# Patient Record
Sex: Male | Born: 1935 | Race: White | Hispanic: No | Marital: Married | Smoking: Former smoker
Health system: Southern US, Community
[De-identification: ages and names within clinical notes are randomized; demographics above are authoritative.]

## PROBLEM LIST (undated history)

## (undated) DIAGNOSIS — I10 Essential (primary) hypertension: Secondary | ICD-10-CM

## (undated) DIAGNOSIS — E119 Type 2 diabetes mellitus without complications: Secondary | ICD-10-CM

## (undated) DIAGNOSIS — J8410 Pulmonary fibrosis, unspecified: Secondary | ICD-10-CM

## (undated) DIAGNOSIS — G459 Transient cerebral ischemic attack, unspecified: Secondary | ICD-10-CM

## (undated) DIAGNOSIS — F0390 Unspecified dementia without behavioral disturbance: Secondary | ICD-10-CM

## (undated) HISTORY — PX: CORONARY ANGIOPLASTY WITH STENT PLACEMENT: SHX49

---

## 2013-07-19 ENCOUNTER — Emergency Department: Payer: Self-pay | Admitting: Internal Medicine

## 2016-04-06 ENCOUNTER — Emergency Department: Payer: No Typology Code available for payment source

## 2016-04-06 ENCOUNTER — Encounter: Payer: Self-pay | Admitting: Emergency Medicine

## 2016-04-06 ENCOUNTER — Emergency Department
Admission: EM | Admit: 2016-04-06 | Discharge: 2016-04-06 | Disposition: A | Discharge status: Home / Self Care | Payer: No Typology Code available for payment source | Attending: Emergency Medicine | Admitting: Emergency Medicine

## 2016-04-06 DIAGNOSIS — S0091XA Abrasion of unspecified part of head, initial encounter: Secondary | ICD-10-CM | POA: Insufficient documentation

## 2016-04-06 DIAGNOSIS — Y92410 Unspecified street and highway as the place of occurrence of the external cause: Secondary | ICD-10-CM | POA: Diagnosis not present

## 2016-04-06 DIAGNOSIS — Z955 Presence of coronary angioplasty implant and graft: Secondary | ICD-10-CM | POA: Diagnosis not present

## 2016-04-06 DIAGNOSIS — Y999 Unspecified external cause status: Secondary | ICD-10-CM | POA: Insufficient documentation

## 2016-04-06 DIAGNOSIS — Y9389 Activity, other specified: Secondary | ICD-10-CM | POA: Insufficient documentation

## 2016-04-06 DIAGNOSIS — Z87891 Personal history of nicotine dependence: Secondary | ICD-10-CM | POA: Insufficient documentation

## 2016-04-06 DIAGNOSIS — I10 Essential (primary) hypertension: Secondary | ICD-10-CM | POA: Diagnosis not present

## 2016-04-06 DIAGNOSIS — S29011A Strain of muscle and tendon of front wall of thorax, initial encounter: Secondary | ICD-10-CM | POA: Insufficient documentation

## 2016-04-06 DIAGNOSIS — E119 Type 2 diabetes mellitus without complications: Secondary | ICD-10-CM | POA: Insufficient documentation

## 2016-04-06 DIAGNOSIS — T148XXA Other injury of unspecified body region, initial encounter: Secondary | ICD-10-CM

## 2016-04-06 DIAGNOSIS — S299XXA Unspecified injury of thorax, initial encounter: Secondary | ICD-10-CM | POA: Diagnosis present

## 2016-04-06 DIAGNOSIS — R0789 Other chest pain: Secondary | ICD-10-CM

## 2016-04-06 DIAGNOSIS — G459 Transient cerebral ischemic attack, unspecified: Secondary | ICD-10-CM | POA: Insufficient documentation

## 2016-04-06 HISTORY — DX: Type 2 diabetes mellitus without complications: E11.9

## 2016-04-06 HISTORY — DX: Transient cerebral ischemic attack, unspecified: G45.9

## 2016-04-06 HISTORY — DX: Pulmonary fibrosis, unspecified: J84.10

## 2016-04-06 HISTORY — DX: Essential (primary) hypertension: I10

## 2016-04-06 LAB — BASIC METABOLIC PANEL
ANION GAP: 7 (ref 5–15)
BUN: 13 mg/dL (ref 6–20)
CALCIUM: 9 mg/dL (ref 8.9–10.3)
CO2: 24 mmol/L (ref 22–32)
Chloride: 103 mmol/L (ref 101–111)
Creatinine, Ser: 1.31 mg/dL — ABNORMAL HIGH (ref 0.61–1.24)
GFR calc Af Amer: 58 mL/min — ABNORMAL LOW (ref >60)
GFR calc non Af Amer: 50 mL/min — ABNORMAL LOW (ref >60)
GLUCOSE: 164 mg/dL — AB (ref 65–99)
Potassium: 4.4 mmol/L (ref 3.5–5.1)
Sodium: 134 mmol/L — ABNORMAL LOW (ref 135–145)

## 2016-04-06 LAB — CBC
HEMATOCRIT: 44.6 % (ref 40.0–52.0)
HEMOGLOBIN: 15.4 g/dL (ref 13.0–18.0)
MCH: 30.8 pg (ref 26.0–34.0)
MCHC: 34.5 g/dL (ref 32.0–36.0)
MCV: 89.3 fL (ref 80.0–100.0)
Platelets: 121 10*3/uL — ABNORMAL LOW (ref 150–440)
RBC: 4.99 MIL/uL (ref 4.40–5.90)
RDW: 13.7 % (ref 11.5–14.5)
WBC: 12.1 10*3/uL — ABNORMAL HIGH (ref 3.8–10.6)

## 2016-04-06 LAB — TROPONIN I
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
## 2019-08-01 ENCOUNTER — Emergency Department: Payer: No Typology Code available for payment source

## 2019-08-01 ENCOUNTER — Observation Stay
Admission: EM | Admit: 2019-08-01 | Discharge: 2019-08-02 | Disposition: A | Discharge status: Home / Self Care | Payer: No Typology Code available for payment source | Attending: Internal Medicine | Admitting: Internal Medicine

## 2019-08-01 ENCOUNTER — Other Ambulatory Visit: Payer: Self-pay

## 2019-08-01 DIAGNOSIS — Z7982 Long term (current) use of aspirin: Secondary | ICD-10-CM | POA: Insufficient documentation

## 2019-08-01 DIAGNOSIS — I10 Essential (primary) hypertension: Secondary | ICD-10-CM | POA: Diagnosis present

## 2019-08-01 DIAGNOSIS — S01111A Laceration without foreign body of right eyelid and periocular area, initial encounter: Secondary | ICD-10-CM | POA: Diagnosis not present

## 2019-08-01 DIAGNOSIS — Z7902 Long term (current) use of antithrombotics/antiplatelets: Secondary | ICD-10-CM | POA: Diagnosis not present

## 2019-08-01 DIAGNOSIS — Z8673 Personal history of transient ischemic attack (TIA), and cerebral infarction without residual deficits: Secondary | ICD-10-CM | POA: Diagnosis not present

## 2019-08-01 DIAGNOSIS — Z880 Allergy status to penicillin: Secondary | ICD-10-CM | POA: Diagnosis not present

## 2019-08-01 DIAGNOSIS — J8410 Pulmonary fibrosis, unspecified: Secondary | ICD-10-CM | POA: Diagnosis present

## 2019-08-01 DIAGNOSIS — I129 Hypertensive chronic kidney disease with stage 1 through stage 4 chronic kidney disease, or unspecified chronic kidney disease: Secondary | ICD-10-CM | POA: Diagnosis not present

## 2019-08-01 DIAGNOSIS — R4701 Aphasia: Secondary | ICD-10-CM | POA: Insufficient documentation

## 2019-08-01 DIAGNOSIS — K219 Gastro-esophageal reflux disease without esophagitis: Secondary | ICD-10-CM | POA: Diagnosis not present

## 2019-08-01 DIAGNOSIS — U071 COVID-19: Secondary | ICD-10-CM

## 2019-08-01 DIAGNOSIS — N1831 Chronic kidney disease, stage 3a: Secondary | ICD-10-CM

## 2019-08-01 DIAGNOSIS — Z87891 Personal history of nicotine dependence: Secondary | ICD-10-CM | POA: Insufficient documentation

## 2019-08-01 DIAGNOSIS — Z79899 Other long term (current) drug therapy: Secondary | ICD-10-CM | POA: Insufficient documentation

## 2019-08-01 DIAGNOSIS — E162 Hypoglycemia, unspecified: Secondary | ICD-10-CM | POA: Diagnosis present

## 2019-08-01 DIAGNOSIS — Z955 Presence of coronary angioplasty implant and graft: Secondary | ICD-10-CM | POA: Insufficient documentation

## 2019-08-01 DIAGNOSIS — G9389 Other specified disorders of brain: Secondary | ICD-10-CM | POA: Insufficient documentation

## 2019-08-01 DIAGNOSIS — F329 Major depressive disorder, single episode, unspecified: Secondary | ICD-10-CM | POA: Diagnosis not present

## 2019-08-01 DIAGNOSIS — E785 Hyperlipidemia, unspecified: Secondary | ICD-10-CM | POA: Insufficient documentation

## 2019-08-01 DIAGNOSIS — I2510 Atherosclerotic heart disease of native coronary artery without angina pectoris: Secondary | ICD-10-CM | POA: Diagnosis present

## 2019-08-01 DIAGNOSIS — N183 Chronic kidney disease, stage 3 unspecified: Secondary | ICD-10-CM | POA: Diagnosis present

## 2019-08-01 DIAGNOSIS — E1122 Type 2 diabetes mellitus with diabetic chronic kidney disease: Secondary | ICD-10-CM | POA: Diagnosis not present

## 2019-08-01 DIAGNOSIS — Z881 Allergy status to other antibiotic agents status: Secondary | ICD-10-CM | POA: Insufficient documentation

## 2019-08-01 DIAGNOSIS — Z888 Allergy status to other drugs, medicaments and biological substances status: Secondary | ICD-10-CM | POA: Insufficient documentation

## 2019-08-01 DIAGNOSIS — R9082 White matter disease, unspecified: Secondary | ICD-10-CM | POA: Insufficient documentation

## 2019-08-01 DIAGNOSIS — G459 Transient cerebral ischemic attack, unspecified: Secondary | ICD-10-CM | POA: Diagnosis present

## 2019-08-01 DIAGNOSIS — S0181XA Laceration without foreign body of other part of head, initial encounter: Secondary | ICD-10-CM | POA: Diagnosis not present

## 2019-08-01 DIAGNOSIS — E1129 Type 2 diabetes mellitus with other diabetic kidney complication: Secondary | ICD-10-CM

## 2019-08-01 DIAGNOSIS — E119 Type 2 diabetes mellitus without complications: Secondary | ICD-10-CM | POA: Diagnosis not present

## 2019-08-01 DIAGNOSIS — E11649 Type 2 diabetes mellitus with hypoglycemia without coma: Secondary | ICD-10-CM | POA: Diagnosis not present

## 2019-08-01 DIAGNOSIS — W19XXXA Unspecified fall, initial encounter: Secondary | ICD-10-CM | POA: Insufficient documentation

## 2019-08-01 DIAGNOSIS — R479 Unspecified speech disturbances: Secondary | ICD-10-CM | POA: Diagnosis present

## 2019-08-01 DIAGNOSIS — F418 Other specified anxiety disorders: Secondary | ICD-10-CM | POA: Diagnosis present

## 2019-08-01 HISTORY — DX: COVID-19: U07.1

## 2019-08-01 LAB — GLUCOSE, CAPILLARY
Glucose-Capillary: 105 mg/dL — ABNORMAL HIGH (ref 70–99)
Glucose-Capillary: 110 mg/dL — ABNORMAL HIGH (ref 70–99)
Glucose-Capillary: 188 mg/dL — ABNORMAL HIGH (ref 70–99)
Glucose-Capillary: 228 mg/dL — ABNORMAL HIGH (ref 70–99)
Glucose-Capillary: 27 mg/dL — CL (ref 70–99)
Glucose-Capillary: 58 mg/dL — ABNORMAL LOW (ref 70–99)
Glucose-Capillary: 65 mg/dL — ABNORMAL LOW (ref 70–99)
Glucose-Capillary: 86 mg/dL (ref 70–99)

## 2019-08-01 LAB — COMPREHENSIVE METABOLIC PANEL
ALT: 14 U/L (ref 0–44)
AST: 32 U/L (ref 15–41)
Albumin: 3.2 g/dL — ABNORMAL LOW (ref 3.5–5.0)
Alkaline Phosphatase: 76 U/L (ref 38–126)
Anion gap: 7 (ref 5–15)
BUN: 14 mg/dL (ref 8–23)
CO2: 27 mmol/L (ref 22–32)
Calcium: 8.2 mg/dL — ABNORMAL LOW (ref 8.9–10.3)
Chloride: 98 mmol/L (ref 98–111)
Creatinine, Ser: 1.29 mg/dL — ABNORMAL HIGH (ref 0.61–1.24)
GFR calc Af Amer: 59 mL/min — ABNORMAL LOW (ref >60)
GFR calc non Af Amer: 51 mL/min — ABNORMAL LOW (ref >60)
Glucose, Bld: 267 mg/dL — ABNORMAL HIGH (ref 70–99)
Potassium: 3.5 mmol/L (ref 3.5–5.1)
Sodium: 132 mmol/L — ABNORMAL LOW (ref 135–145)
Total Bilirubin: 0.5 mg/dL (ref 0.3–1.2)
Total Protein: 6.4 g/dL — ABNORMAL LOW (ref 6.5–8.1)

## 2019-08-01 LAB — CBC WITH DIFFERENTIAL/PLATELET
Abs Immature Granulocytes: 0.03 10*3/uL (ref 0.00–0.07)
Basophils Absolute: 0 10*3/uL (ref 0.0–0.1)
Basophils Relative: 1 %
Eosinophils Absolute: 0.1 10*3/uL (ref 0.0–0.5)
Eosinophils Relative: 1 %
HCT: 38.1 % — ABNORMAL LOW (ref 39.0–52.0)
Hemoglobin: 12.7 g/dL — ABNORMAL LOW (ref 13.0–17.0)
Immature Granulocytes: 1 %
Lymphocytes Relative: 15 %
Lymphs Abs: 0.8 10*3/uL (ref 0.7–4.0)
MCH: 30.4 pg (ref 26.0–34.0)
MCHC: 33.3 g/dL (ref 30.0–36.0)
MCV: 91.1 fL (ref 80.0–100.0)
Monocytes Absolute: 0.6 10*3/uL (ref 0.1–1.0)
Monocytes Relative: 12 %
Neutro Abs: 3.6 10*3/uL (ref 1.7–7.7)
Neutrophils Relative %: 70 %
Platelets: 151 10*3/uL (ref 150–400)
RBC: 4.18 MIL/uL — ABNORMAL LOW (ref 4.22–5.81)
RDW: 13.1 % (ref 11.5–15.5)
WBC: 5.1 10*3/uL (ref 4.0–10.5)
nRBC: 0 % (ref 0.0–0.2)

## 2019-08-01 LAB — URINALYSIS, ROUTINE W REFLEX MICROSCOPIC
Bacteria, UA: NONE SEEN
Bilirubin Urine: NEGATIVE
Glucose, UA: 150 mg/dL — AB
Ketones, ur: NEGATIVE mg/dL
Leukocytes,Ua: NEGATIVE
Nitrite: NEGATIVE
Protein, ur: 30 mg/dL — AB
Specific Gravity, Urine: 1.01 (ref 1.005–1.030)
pH: 5 (ref 5.0–8.0)

## 2019-08-01 LAB — RESPIRATORY PANEL BY RT PCR (FLU A&B, COVID)
Influenza A by PCR: NEGATIVE
Influenza B by PCR: NEGATIVE
SARS Coronavirus 2 by RT PCR: POSITIVE — AB

## 2019-08-01 LAB — PROCALCITONIN: Procalcitonin: <0.1 ng/mL

## 2019-08-01 LAB — TROPONIN I (HIGH SENSITIVITY)
Troponin I (High Sensitivity): 7 ng/L (ref <18)
Troponin I (High Sensitivity): 9 ng/L (ref <18)

## 2019-08-01 MED ORDER — SODIUM CHLORIDE 4 MEQ/ML IV SOLN: INTRAVENOUS | Status: DC

## 2019-08-01 MED ORDER — METOPROLOL TARTRATE 25 MG PO TABS
25.0000 mg | ORAL_TABLET | Freq: Two times a day (BID) | ORAL | Status: DC
  Administered 2019-08-01: 25 mg via ORAL
  Filled 2019-08-01 (×2): qty 1

## 2019-08-01 MED ORDER — OCTREOTIDE ACETATE 100 MCG/ML IJ SOLN
50.0000 ug | Freq: Once | INTRAMUSCULAR | Status: AC
  Administered 2019-08-01: 50 ug via SUBCUTANEOUS
  Filled 2019-08-01: qty 0.5

## 2019-08-01 MED ORDER — ACETAMINOPHEN 500 MG PO TABS: 1000.0000 mg | ORAL_TABLET | Freq: Four times a day (QID) | ORAL | Status: DC | PRN

## 2019-08-01 MED ORDER — DEXTROSE 250 MG/ML IV SOLN: 25.0000 g | Freq: Once | INTRAVENOUS | Status: DC

## 2019-08-01 MED ORDER — HYDRALAZINE HCL 50 MG PO TABS: 25.0000 mg | ORAL_TABLET | Freq: Three times a day (TID) | ORAL | Status: DC | PRN

## 2019-08-01 MED ORDER — DEXTROSE 50 % IV SOLN
25.0000 mL | Freq: Once | INTRAVENOUS | Status: AC
  Administered 2019-08-01: 13:00:00 25 mL via INTRAVENOUS

## 2019-08-01 MED ORDER — DM-GUAIFENESIN ER 30-600 MG PO TB12
1.0000 | ORAL_TABLET | Freq: Two times a day (BID) | ORAL | Status: DC
  Administered 2019-08-01 – 2019-08-02 (×2): 1 via ORAL
  Filled 2019-08-01 (×2): qty 1

## 2019-08-01 MED ORDER — SODIUM CHLORIDE 0.9 % IV BOLUS
500.0000 mL | Freq: Once | INTRAVENOUS | Status: AC
Start: 2019-08-01 — End: 2019-08-01
  Administered 2019-08-01: 500 mL via INTRAVENOUS

## 2019-08-01 MED ORDER — KETOROLAC TROMETHAMINE 15 MG/ML IJ SOLN
7.5000 mg | Freq: Once | INTRAMUSCULAR | Status: AC
  Administered 2019-08-01: 7.5 mg via INTRAVENOUS
  Filled 2019-08-01 (×2): qty 1

## 2019-08-01 MED ORDER — DEXTROSE 10 % IV SOLN: INTRAVENOUS | Status: DC

## 2019-08-01 MED ORDER — DEXTROSE 50 % IV SOLN
25.0000 mL | Freq: Once | INTRAVENOUS | Status: AC
  Administered 2019-08-01: 25 mL via INTRAVENOUS

## 2019-08-01 MED ORDER — DEXTROSE 50 % IV SOLN
25.0000 mL | INTRAVENOUS | Status: DC | PRN
  Administered 2019-08-01: 25 mL via INTRAVENOUS
  Filled 2019-08-01: qty 50

## 2019-08-01 MED ORDER — METOPROLOL TARTRATE 25 MG PO TABS: 25.0000 mg | ORAL_TABLET | Freq: Two times a day (BID) | ORAL | Status: DC

## 2019-08-01 MED ORDER — IPRATROPIUM BROMIDE HFA 17 MCG/ACT IN AERS
2.0000 | INHALATION_SPRAY | Freq: Four times a day (QID) | RESPIRATORY_TRACT | Status: DC
  Administered 2019-08-01 – 2019-08-02 (×3): 2 via RESPIRATORY_TRACT
  Filled 2019-08-01: qty 12.9

## 2019-08-01 MED ORDER — ALBUTEROL SULFATE HFA 108 (90 BASE) MCG/ACT IN AERS
2.0000 | INHALATION_SPRAY | RESPIRATORY_TRACT | Status: DC | PRN
  Filled 2019-08-01: qty 6.7

## 2019-08-01 MED ORDER — TRAMADOL HCL 50 MG PO TABS: 50.0000 mg | ORAL_TABLET | Freq: Four times a day (QID) | ORAL | Status: DC | PRN

## 2019-08-01 NOTE — ED Notes (Signed)
Pt provided juice.

## 2019-08-01 NOTE — ED Notes (Signed)
Son updated and informed of admission process- states pt's wife has dementia and he would like to be the one contacted with updates if possible.

## 2019-08-01 NOTE — ED Provider Notes (Signed)
Repeat glucose has dropped again to 86 despite D10 by EMS and D50 x 2 here (and later 65 then 58). 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 He denies any accidental overuse or misuse. However, given his repetitive dropping will plan to admit for continued monitoring and dextrose infusion. Will give dose of octreotide here as well, and plan to admit. Discussed w/ hospitalist for admission.      .Critical Care Performed by: Miguel Aschoff., MD Authorized by: Miguel Aschoff., MD   Critical care provider statement:    Critical care time (minutes):  45   Critical care was necessary to treat or prevent imminent or life-threatening deterioration of the following conditions:  Toxidrome and endocrine crisis (sulfonylurea toxicity)   Critical care was time spent personally by me on the following activities:  Discussions with consultants, evaluation of patient's response to treatment, examination of patient, ordering and performing treatments and interventions, ordering and review of laboratory studies, ordering and review of radiographic studies, pulse oximetry, re-evaluation of patient's condition, obtaining history from patient or surrogate and review of old charts      Miguel Aschoff., MD 08/01/19 1845

## 2019-08-01 NOTE — ED Notes (Signed)
Pt assisted with urinal

## 2019-08-01 NOTE — ED Notes (Signed)
Report given to inpatient RN, transportation requested.  

## 2019-08-01 NOTE — Discharge Instructions (Addendum)
Keep a log of his sugars at home. Ensure you do not skip any meals use your oxygen as before self quarantine yourself for at least 10 days

## 2019-08-01 NOTE — ED Provider Notes (Signed)
[facility] Emergency Department Provider Note  ____________________________________________   First MD Initiated Contact with Patient 08/01/19 1257     (approximate)  I have reviewed the triage vital signs and the nursing notes.   HISTORY  Chief Complaint Aphasia    HPI Frank Khan is a 85 y.o. 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  Patient was diagnosed with coronavirus 2 days ago.  According to family he was not as verbal as normal and they were concerned he could be have another stroke.  However on EMS arrival he just was really congested and after he cleared some mucus out of his throat he was able to speak.  He had a normal stroke exam.  Sugars were slightly low in the 60s and patient was given D10.  Patient himself denies any concerns at this time.  He states he feels at his normal self.  He does have a lot of nasal congestion that is been constant, started today, nothing makes it better, nothing makes it worse          Past Medical History:  Diagnosis Date  . Diabetes mellitus without complication (HCC)   . Hypertension   . Pulmonary fibrosis (HCC)   . TIA (transient ischemic attack)     There are no problems to display for this patient.   Past Surgical History:  Procedure Laterality Date  . CORONARY ANGIOPLASTY WITH STENT PLACEMENT      Prior to Admission medications   Not on File    Allergies Penicillins and Iodine  No family history on file.  Social History Social History   Tobacco Use  . Smoking status: Former Games developer  . Smokeless tobacco: Never Used  Substance Use Topics  . Alcohol use: No  . Drug use: No      Review of Systems Constitutional: No fever/chills Eyes: No visual changes. ENT: No sore throat.  Positive congestion Cardiovascular: Denies chest pain. Respiratory: Denies shortness of breath. Gastrointestinal: No abdominal pain.  No  nausea, no vomiting.  No diarrhea.  No constipation. Genitourinary: Negative for dysuria. Musculoskeletal: Negative for back pain. Skin: Negative for rash. Neurological: Negative for headaches, focal weakness or numbness.  Concern for difficulties with speaking All other ROS negative ____________________________________________   PHYSICAL EXAM:  VITAL SIGNS: Blood pressure 137/68, pulse 88, temperature 97.6 F (36.4 C), temperature source Oral, resp. rate 16, height 6' (1.829 m), weight 66 kg, SpO2 98 %.   Constitutional: Alert and oriented x3. Well appearing and in no acute distress. Eyes: Conjunctivae are normal. EOMI. Head: Atraumatic. Nose: No congestion/rhinnorhea. Mouth/Throat: Mucous membranes are moist.   Neck: No stridor. Trachea Midline. FROM Cardiovascular: Normal rate, regular rhythm. Grossly normal heart sounds.  Good peripheral circulation. Respiratory: Normal respiratory effort.  No retractions. Lungs CTAB. Gastrointestinal: Soft and nontender. No distention. No abdominal bruits.  Musculoskeletal: No lower extremity tenderness nor edema.  No joint effusions. Neurologic: Cranial nerves II through XII are intact, no aphasia noted.  Alert and oriented x3 Skin:  Skin is warm, dry and intact. No rash noted. Psychiatric: Mood and affect are normal. Speech and behavior are normal. GU: Deferred   ____________________________________________   LABS (all labs ordered are listed, but only abnormal results are displayed)  Labs Reviewed  CBC WITH DIFFERENTIAL/PLATELET - Abnormal; Notable for the following components:      Result Value   RBC 4.18 (*)    Hemoglobin 12.7 (*)  HCT 38.1 (*)    All other components within normal limits  COMPREHENSIVE METABOLIC PANEL - Abnormal; Notable for the following components:   Sodium 132 (*)    Glucose, Bld 267 (*)    Creatinine, Ser 1.29 (*)    Calcium 8.2 (*)    Total Protein 6.4 (*)    Albumin 3.2 (*)    GFR calc non Af Amer  51 (*)    GFR calc Af Amer 59 (*)    All other components within normal limits  URINALYSIS, ROUTINE W REFLEX MICROSCOPIC - Abnormal; Notable for the following components:   Color, Urine YELLOW (*)    APPearance CLEAR (*)    Glucose, UA 150 (*)    Hgb urine dipstick SMALL (*)    Protein, ur 30 (*)    All other components within normal limits  GLUCOSE, CAPILLARY - Abnormal; Notable for the following components:   Glucose-Capillary 27 (*)    All other components within normal limits  GLUCOSE, CAPILLARY - Abnormal; Notable for the following components:   Glucose-Capillary 105 (*)    All other components within normal limits  PROCALCITONIN  CBG MONITORING, ED  CBG MONITORING, ED  TROPONIN I (HIGH SENSITIVITY)  TROPONIN I (HIGH SENSITIVITY)   ____________________________________________   ED ECG REPORT I, Vanessa , the attending physician, personally viewed and interpreted this ECG.  EKG is sinus rate of 87, no ST elevations, T wave inversions in V1 through V3, lead III, right bundle branch block with left anterior fascicular block.  T wave inversions do look new in V1 through V3 although his EKG last was in 3 years ago ____________________________________________  RADIOLOGY I, Vanessa , personally viewed and evaluated these images (plain radiographs) as part of my medical decision making, as well as reviewing the written report by the radiologist.  ED MD interpretation: Chronic lung issues consistent with patient's known pulmonary fibrosis  Official radiology report(s): CT Head Wo Contrast  Result Date: 08/01/2019 CLINICAL DATA:  Head trauma, headache, speech difficulty EXAM: CT HEAD WITHOUT CONTRAST TECHNIQUE: Contiguous axial images were obtained from the base of the skull through the vertex without intravenous contrast. COMPARISON:  04/06/2016 FINDINGS: Brain: No evidence of acute infarction, hemorrhage, hydrocephalus, extra-axial collection or mass lesion/mass effect.  New, although nonacute encephalomalacia of the right occipital lobe. Periventricular white matter hypodensity. Vascular: No hyperdense vessel or unexpected calcification. Skull: Normal. Negative for fracture or focal lesion. Sinuses/Orbits: No acute finding. Other: None. IMPRESSION: 1. No acute intracranial pathology. 2. New, although nonacute encephalomalacia of the right occipital lobe when compared to prior CT dated 2017. 3. Small-vessel white matter disease. Electronically Signed   By: Eddie Candle M.D.   On: 08/01/2019 14:11   DG Chest Portable 1 View  Result Date: 08/01/2019 CLINICAL DATA:  Trouble speaking this morning. EXAM: PORTABLE CHEST 1 VIEW COMPARISON:  04/06/2016 FINDINGS: Cardiac silhouette is normal in size. No mediastinal or hilar masses. Lungs are hyperexpanded. There are chronic changes of parenchymal fibrosis that are stable. No evidence of pneumonia or pulmonary edema. No pleural effusion or pneumothorax. Skeletal structures are grossly intact. IMPRESSION: 1. No acute cardiopulmonary disease. 2. Chronic lung changes consistent with pulmonary fibrosis. Electronically Signed   By: Lajean Manes M.D.   On: 08/01/2019 13:57    ____________________________________________   PROCEDURES  Procedure(s) performed (including Critical Care):  Procedures   ____________________________________________   INITIAL IMPRESSION / ASSESSMENT AND PLAN / ED COURSE  [name] was evaluated in Emergency Department  on 08/01/2019 for the symptoms described in the history of present illness. He was evaluated in the context of the global COVID-19 pandemic, which necessitated consideration that the patient might be at risk for infection with the SARS-CoV-2 virus that causes COVID-19. Institutional protocols and algorithms that pertain to the evaluation of patients at risk for COVID-19 are in a state of rapid change based on information released by regulatory bodies including the CDC and federal and  state organizations. These policies and algorithms were followed during the patient's care in the ED.    Patient is an 85 year old who was diagnosed with Covid 2 days ago who comes in with not talking.  Family is concerned that he could have had a new stroke.  However patient sugars were noted to be hypoglycemic.  Upon EMS arrival his stroke scale was normal and after he cleared his throat from some congestion he was speaking normally.  Upon my arrival patient was alert and oriented x3 and moving all extremities however he did have a sugar in the 20s.  Patient is on glyburide.  Patient was given D50 and given some oral hydration.  Will get labs to evaluate for Electra abnormalities, AKI.  Will get CT scan to evaluate for hemorrhage.  Discussed with family over the phone that he could have had a TIA but he is already on aspirin and Plavix and this is more likely from his low sugar versus from his congestion from his known Covid and if he is remains at his baseline that probably would be best for patient to go home.  Patient's family felt comfortable with this plan.  Kidney function is around baseline  No white count elevation.  Patient is not hypoxic on his baseline 2 L and his procalcitonin is negative and chest x-ray shows his baseline fibrosis.  Repeat sugars have gone down to 100.  Patient handed off to oncoming team pending repeat sugars.  If sugars continue to go down given patient is on glipizide which is long-acting he may require admission for low blood sugars.  If they can remain elevated and patient is able to stand up and ambulate patient could potentially be able to be discharged home  CT imaging new nonacute encephalomalacia in the right occipital lobe.  Although it does not look acute in nature and this would not Splane the aphasia.  I did discuss these results with patient's wife who will have him followed up with his primary doctor       ____________________________________________   FINAL CLINICAL IMPRESSION(S) / ED DIAGNOSES   Final diagnoses:  Hypoglycemia  COVID-19      MEDICATIONS GIVEN DURING THIS VISIT:  Medications  sodium chloride 0.9 % bolus 500 mL (500 mLs Intravenous New Bag/Given 08/01/19 1456)  dextrose 50 % solution 25 mL (25 mLs Intravenous Given 08/01/19 1316)  dextrose 50 % solution 25 mL (25 mLs Intravenous Given 08/01/19 1315)     ED Discharge Orders    None       Note:  This document was prepared using Dragon voice recognition software and may include unintentional dictation errors.   Concha Se, MD 08/01/19 [id number]

## 2019-08-01 NOTE — ED Notes (Signed)
Pt assisted with urinal. States he gets cold and it's no good for his back when he stands. Male external catheter explained and offered to pt- pt refuses and says if he cannot stand up to urinate he will pee all over the bed and go home. Pt continually removes face mask and Vega and refuses to wear despite education.

## 2019-08-01 NOTE — ED Notes (Signed)
Pt states he is very cold and shaky despite 7 blankets.

## 2019-08-01 NOTE — ED Triage Notes (Addendum)
Per EMS, family said pt had trouble speaking "this morning." Per EMS pt was without speech difficulty, negative for stroke symptoms, strong in all four extremities. BGL was 66 per EMS- 200 d-10 given. Per ems pt recently covid positive.  Pt aox4, pt denies any s/s, denies pain. All four extremities strong, no speech difficulty noted. Congestion noted.

## 2019-08-01 NOTE — ED Notes (Signed)
Dextrose 10% started at 75 ml's /hr per admitting MD verbal order- order's to follow per MD.

## 2019-08-01 NOTE — H&P (Signed)
History and Physical    Frank Khan KGM:[id number] DOB: 02/17/1935 DOA: 08/01/2019  Referring MD/NP/PA:   PCP: [facility]   Patient coming from:  The patient is coming from home.  At baseline, pt is independent for most of ADL.        Chief Complaint: Difficulty speaking  HPI: Frank Khan is a 85 y.o. male with medical history significant of hypertension, hyperlipidemia, diabetes mellitus, pulmonary fibrosis on 2 L oxygen at home, TIA, GERD, depression, CAD, stent placement, CKD-3, who presents with difficulty speaking.  Per his wife (I called his wife by phone), patient had difficulty speaking started at about 10 AM, lasted for about an hour, then resolved.  Patient does not have unilateral weakness, numbness or tingling his extremities.  No facial droop or slurred speech. Per his wife, patient had positive Covid test 2 weeks ago, but pt states that he had positive Covid 19 test 2 days ago. No formal documentation is available. Patient has history pulmonary fibrosis, using 2 L oxygen at home.  Patient has chronic cough and shortness breath, which has not changed.  No fever or chills.  No chest pain, no nausea vomiting, diarrhea, abdominal pain, symptoms of UTI.  Patient also found to have hypoglycemia with blood sugar down to 20s. Pt was given D10 by EMS and D50. Blood sugar increased to 267, but then dropped again to 88.   ED Course: pt was found to have troponin 7, 9,  Pending covid PCR, stable renal function, WBC 5.1, temperature normal, oxygen saturation 92% on home level of 2 L nasal cannula oxygen, blood pressure 144/77, heart rate 97, RR 27, chest x-ray showed pulmonary fibrosis without new infiltration.  CT head is negative for acute intracranial abnormalities the patient is placed MedSurg bed for observation  Review of Systems:   General: no fevers, chills, no body weight gain, has poor appetite, has fatigue HEENT: no blurry vision, hearing changes or sore  throat Respiratory: has dyspnea, coughing, no wheezing CV: no chest pain, no palpitations GI: no nausea, vomiting, abdominal pain, diarrhea, constipation GU: no dysuria, burning on urination, increased urinary frequency, hematuria  Ext: no leg edema Neuro: no unilateral weakness, numbness, or tingling, no vision change or hearing loss. Has difficult speaking. Skin: no rash, no skin tear. MSK: No muscle spasm, no deformity, no limitation of range of movement in spin Heme: No easy bruising.  Travel history: No recent long distant travel.  Allergy:  Allergies  Allergen Reactions  . Doxycycline     [facility], reaction not listed   . Penicillins Hives    Has patient had a PCN reaction causing immediate rash, facial/tongue/throat swelling, SOB or lightheadedness with hypotension: yes Has patient had a PCN reaction causing severe rash involving mucus membranes or skin necrosis: no Has patient had a PCN reaction that required hospitalization no Has patient had a PCN reaction occurring within the last 10 years: no If all of the above answers are "NO", then may proceed with Cephalosporin use.    . Simvastatin     [facility] records, reaction not given  . Chlorhexidine Itching  . Iodine Rash    Past Medical History:  Diagnosis Date  . Diabetes mellitus without complication (HCC)   . Hypertension   . Pulmonary fibrosis (HCC)   . TIA (transient ischemic attack)     Past Surgical History:  Procedure Laterality Date  . CORONARY ANGIOPLASTY WITH STENT PLACEMENT  Social History:  reports that he has quit smoking. He has never used smokeless tobacco. He reports that he does not drink alcohol or use drugs.  Family History:  Family History  Problem Relation Age of Onset  . Hyperlipidemia Sister      Prior to Admission medications   Medication Sig Start Date End Date Taking? Authorizing Provider  aspirin 81 MG chewable tablet Chew 81 mg by mouth daily.   Yes [provider]  clopidogrel (PLAVIX) 75 MG tablet Take 75 mg by mouth daily. 10/27/18 10/27/19 Yes [provider]  ezetimibe (ZETIA) 10 MG tablet Take 10 mg by mouth daily.   Yes [provider]  glipiZIDE (GLUCOTROL) 5 MG tablet Take 5 mg by mouth in the morning and at bedtime.   Yes [provider]  losartan (COZAAR) 25 MG tablet Take 25 mg by mouth daily.   Yes [provider]  metoprolol tartrate (LOPRESSOR) 50 MG tablet Take 25 mg by mouth in the morning and at bedtime.   Yes [provider]  mirtazapine (REMERON) 7.5 MG tablet Take 7.5 mg by mouth at bedtime.   Yes [provider]  pantoprazole (PROTONIX) 40 MG tablet Take 40 mg by mouth daily.   Yes [provider]    Physical Exam: Vitals:   08/01/19 1719 08/01/19 1730 08/01/19 1757 08/01/19 1800  BP:  (!) 158/83  (!) 158/95  Pulse:  96  (!) 117  Resp: 19 (!) 34  (!) 21  Temp:      TempSrc:      SpO2: 94% 100% 94% 91%  Weight:      Height:       General: Not in acute distress HEENT:       Eyes: PERRL, EOMI, no scleral icterus.       ENT: No discharge from the ears and nose, no pharynx injection, no tonsillar enlargement.        Neck: No JVD, no bruit, no mass felt. Heme: No neck lymph node enlargement. Cardiac: S1/S2, RRR, No murmurs, No gallops or rubs. Respiratory: No rales, wheezing, rhonchi or rubs. GI: Soft, nondistended, nontender, no rebound pain, no organomegaly, BS present. GU: No hematuria Ext: No pitting leg edema bilaterally. 2+DP/PT pulse bilaterally. Musculoskeletal: No joint deformities, No joint redness or warmth, no limitation of ROM in spin. Skin: No rashes.  Neuro: Alert, oriented X3, cranial nerves II-XII grossly intact, moves all extremities normally. Muscle strength 5/5 in all extremities, sensation to light touch intact.  Psych: Patient is not psychotic, no suicidal or hemocidal ideation.  Labs on Admission: I have personally reviewed  following labs and imaging studies  CBC: Recent Labs  Lab 08/01/19 1317  WBC 5.1  NEUTROABS 3.6  HGB 12.7*  HCT 38.1*  MCV 91.1  PLT 151   Basic Metabolic Panel: Recent Labs  Lab 08/01/19 1317  NA 132*  K 3.5  CL 98  CO2 27  GLUCOSE 267*  BUN 14  CREATININE 1.29*  CALCIUM 8.2*   GFR: Estimated Creatinine Clearance: 40.5 mL/min (A) (by C-G formula based on SCr of 1.29 mg/dL (H)). Liver Function Tests: Recent Labs  Lab 08/01/19 1317  AST 32  ALT 14  ALKPHOS 76  BILITOT 0.5  PROT 6.4*  ALBUMIN 3.2*   No results for input(s): LIPASE, AMYLASE in the last 168 hours. No results for input(s): AMMONIA in the last 168 hours. Coagulation Profile: No results for input(s): INR, PROTIME in the last 168 hours. Cardiac  Enzymes: No results for input(s): CKTOTAL, CKMB, CKMBINDEX, TROPONINI in the last 168 hours. BNP (last 3 results) No results for input(s): PROBNP in the last 8760 hours. HbA1C: No results for input(s): HGBA1C in the last 72 hours. CBG: Recent Labs  Lab 08/01/19 1310 08/01/19 1454 08/01/19 1621 08/01/19 1738 08/01/19 1801  GLUCAP 27* 105* 86 65* 58*   Lipid Profile: No results for input(s): CHOL, HDL, LDLCALC, TRIG, CHOLHDL, LDLDIRECT in the last 72 hours. Thyroid Function Tests: No results for input(s): TSH, T4TOTAL, FREET4, T3FREE, THYROIDAB in the last 72 hours. Anemia Panel: No results for input(s): VITAMINB12, FOLATE, FERRITIN, TIBC, IRON, RETICCTPCT in the last 72 hours. Urine analysis:    Component Value Date/Time   COLORURINE YELLOW (A) 08/01/2019 1317   APPEARANCEUR CLEAR (A) 08/01/2019 1317   LABSPEC 1.010 08/01/2019 1317   PHURINE 5.0 08/01/2019 1317   GLUCOSEU 150 (A) 08/01/2019 1317   HGBUR SMALL (A) 08/01/2019 1317   BILIRUBINUR NEGATIVE 08/01/2019 1317   KETONESUR NEGATIVE 08/01/2019 1317   PROTEINUR 30 (A) 08/01/2019 1317   NITRITE NEGATIVE 08/01/2019 1317   LEUKOCYTESUR NEGATIVE 08/01/2019 1317   Sepsis  Labs: @LABRCNTIP (procalcitonin:4,lacticidven:4) )No results found for this or any previous visit (from the past 240 hour(s)).   Radiological Exams on Admission: CT Head Wo Contrast  Result Date: 08/01/2019 CLINICAL DATA:  Head trauma, headache, speech difficulty EXAM: CT HEAD WITHOUT CONTRAST TECHNIQUE: Contiguous axial images were obtained from the base of the skull through the vertex without intravenous contrast. COMPARISON:  04/06/2016 FINDINGS: Brain: No evidence of acute infarction, hemorrhage, hydrocephalus, extra-axial collection or mass lesion/mass effect. New, although nonacute encephalomalacia of the right occipital lobe. Periventricular white matter hypodensity. Vascular: No hyperdense vessel or unexpected calcification. Skull: Normal. Negative for fracture or focal lesion. Sinuses/Orbits: No acute finding. Other: None. IMPRESSION: 1. No acute intracranial pathology. 2. New, although nonacute encephalomalacia of the right occipital lobe when compared to prior CT dated 2017. 3. Small-vessel white matter disease. Electronically Signed   By: Eddie Candle M.D.   On: 08/01/2019 14:11   DG Chest Portable 1 View  Result Date: 08/01/2019 CLINICAL DATA:  Trouble speaking this morning. EXAM: PORTABLE CHEST 1 VIEW COMPARISON:  04/06/2016 FINDINGS: Cardiac silhouette is normal in size. No mediastinal or hilar masses. Lungs are hyperexpanded. There are chronic changes of parenchymal fibrosis that are stable. No evidence of pneumonia or pulmonary edema. No pleural effusion or pneumothorax. Skeletal structures are grossly intact. IMPRESSION: 1. No acute cardiopulmonary disease. 2. Chronic lung changes consistent with pulmonary fibrosis. Electronically Signed   By: Lajean Manes M.D.   On: 08/01/2019 13:57     EKG: Independently reviewed.  Sinus rhythm, QTC 503, low voltage, bifascicular block  Assessment/Plan Principal Problem:   Hypoglycemia Active Problems:   TIA (transient ischemic attack)    Pulmonary fibrosis (HCC)   Hypertension   Diabetes mellitus without complication ([place])   Difficulty speaking   COVID-19 virus infection   CKD (chronic kidney disease), stage III   HLD (hyperlipidemia)   GERD (gastroesophageal reflux disease)   Depression   CAD (coronary artery disease)   Hypoglycemia: likely due to glipizide use which is for diabetes.  -Placed on MedSurg bed for observation under PUI -Hold glipizide -D10 at 75 cc/h -Check CBG every hour -D50 as needed -Patient was given 50 mcg of octreotide in ED  Difficulty speaking: has resolved.  Patient does not have unilateral weakness or numbness in extremities, no facial droop or slurred speech.  Most likely due  to hypoglycemia.  At this moment, low suspicions of  new stroke or TIA. -Frequent neuro check  TIA (transient ischemic attack): -ASA, plavix and zetia  Pulmonary fibrosis (HCC): on 2L nasal cannula oxygen at home, currently saturation 98% on 2 L nasal cannula oxygen in ED. -Atrovent inhaler, as needed albuterol, as needed Mucinex  HTN:  -Continue home medications: Cozaar, metoprolol -hydralazine prn  Diabetes mellitus without complication (HCC): A1c 7.1, poorly controlled.  Now has hypoglycemia -Hold glipizide  COVID-19 virus infection: Wife reports patient had a positive test 2 weeks ago, but the patient states that he had a positive test 2 days ago.  No formal documentation is available.  Chest x-ray does not show infiltration.  Oxygen saturation 92% on home level 2 L nasal cannula oxygen. -Start vitamin C and zinc sulfate -Bronchodilators as above  CKD (chronic kidney disease), stage III: stable -f/u by BMP  HLD (hyperlipidemia) -zetia  GERD (gastroesophageal reflux disease) -protonix  Depression -Remeron  CAD (coronary artery disease): No chest pain -Continue aspirin, Plavix, Zetia   DVT ppx: SQ Lovenox Code Status: Full code Family Communication: Yes, patient's wife on the phone at bed  side Disposition Plan:  Anticipate discharge back to previous home environment Consults called:  none Admission status: Med-surg bed for obs   Date of Service 08/01/2019    Lorretta Harp [facility]   If 7PM-7AM, please contact night-coverage [email or web address] 08/01/2019, 6:21 PM

## 2019-08-01 NOTE — ED Notes (Signed)
Attempted to call report at this time, was on hold for 11 minutes. Will call back.

## 2019-08-02 ENCOUNTER — Encounter: Payer: Self-pay | Admitting: Emergency Medicine

## 2019-08-02 ENCOUNTER — Emergency Department: Payer: No Typology Code available for payment source

## 2019-08-02 ENCOUNTER — Other Ambulatory Visit: Payer: Self-pay

## 2019-08-02 ENCOUNTER — Observation Stay (HOSPITAL_BASED_OUTPATIENT_CLINIC_OR_DEPARTMENT_OTHER)
Admission: EM | Admit: 2019-08-02 | Discharge: 2019-08-03 | Disposition: A | Discharge status: Home / Self Care | Payer: No Typology Code available for payment source | Source: Home / Self Care | Attending: Emergency Medicine | Admitting: Emergency Medicine

## 2019-08-02 DIAGNOSIS — G459 Transient cerebral ischemic attack, unspecified: Secondary | ICD-10-CM | POA: Diagnosis present

## 2019-08-02 DIAGNOSIS — U071 COVID-19: Secondary | ICD-10-CM

## 2019-08-02 DIAGNOSIS — E1129 Type 2 diabetes mellitus with other diabetic kidney complication: Secondary | ICD-10-CM

## 2019-08-02 DIAGNOSIS — E119 Type 2 diabetes mellitus without complications: Secondary | ICD-10-CM

## 2019-08-02 DIAGNOSIS — R55 Syncope and collapse: Secondary | ICD-10-CM

## 2019-08-02 DIAGNOSIS — S01111A Laceration without foreign body of right eyelid and periocular area, initial encounter: Secondary | ICD-10-CM

## 2019-08-02 DIAGNOSIS — F418 Other specified anxiety disorders: Secondary | ICD-10-CM | POA: Diagnosis present

## 2019-08-02 DIAGNOSIS — E162 Hypoglycemia, unspecified: Secondary | ICD-10-CM

## 2019-08-02 DIAGNOSIS — J8410 Pulmonary fibrosis, unspecified: Secondary | ICD-10-CM | POA: Diagnosis present

## 2019-08-02 DIAGNOSIS — K219 Gastro-esophageal reflux disease without esophagitis: Secondary | ICD-10-CM | POA: Diagnosis present

## 2019-08-02 DIAGNOSIS — F329 Major depressive disorder, single episode, unspecified: Secondary | ICD-10-CM | POA: Diagnosis present

## 2019-08-02 DIAGNOSIS — E785 Hyperlipidemia, unspecified: Secondary | ICD-10-CM | POA: Diagnosis present

## 2019-08-02 DIAGNOSIS — I2510 Atherosclerotic heart disease of native coronary artery without angina pectoris: Secondary | ICD-10-CM | POA: Diagnosis present

## 2019-08-02 DIAGNOSIS — N183 Chronic kidney disease, stage 3 unspecified: Secondary | ICD-10-CM | POA: Diagnosis present

## 2019-08-02 DIAGNOSIS — I10 Essential (primary) hypertension: Secondary | ICD-10-CM | POA: Diagnosis present

## 2019-08-02 LAB — CBC WITH DIFFERENTIAL/PLATELET
Abs Immature Granulocytes: 0.05 10*3/uL (ref 0.00–0.07)
Basophils Absolute: 0 10*3/uL (ref 0.0–0.1)
Basophils Relative: 0 %
Eosinophils Absolute: 0 10*3/uL (ref 0.0–0.5)
Eosinophils Relative: 0 %
HCT: 40.3 % (ref 39.0–52.0)
Hemoglobin: 13.7 g/dL (ref 13.0–17.0)
Immature Granulocytes: 0 %
Lymphocytes Relative: 6 %
Lymphs Abs: 0.8 10*3/uL (ref 0.7–4.0)
MCH: 30.7 pg (ref 26.0–34.0)
MCHC: 34 g/dL (ref 30.0–36.0)
MCV: 90.4 fL (ref 80.0–100.0)
Monocytes Absolute: 1 10*3/uL (ref 0.1–1.0)
Monocytes Relative: 7 %
Neutro Abs: 12.5 10*3/uL — ABNORMAL HIGH (ref 1.7–7.7)
Neutrophils Relative %: 87 %
Platelets: 151 10*3/uL (ref 150–400)
RBC: 4.46 MIL/uL (ref 4.22–5.81)
RDW: 13.2 % (ref 11.5–15.5)
WBC: 14.4 10*3/uL — ABNORMAL HIGH (ref 4.0–10.5)
nRBC: 0 % (ref 0.0–0.2)

## 2019-08-02 LAB — GLUCOSE, CAPILLARY
Glucose-Capillary: 228 mg/dL — ABNORMAL HIGH (ref 70–99)
Glucose-Capillary: 163 mg/dL — ABNORMAL HIGH (ref 70–99)
Glucose-Capillary: 94 mg/dL (ref 70–99)
Glucose-Capillary: 87 mg/dL (ref 70–99)
Glucose-Capillary: 48 mg/dL — ABNORMAL LOW (ref 70–99)
Glucose-Capillary: 50 mg/dL — ABNORMAL LOW (ref 70–99)
Glucose-Capillary: 174 mg/dL — ABNORMAL HIGH (ref 70–99)
Glucose-Capillary: 157 mg/dL — ABNORMAL HIGH (ref 70–99)
Glucose-Capillary: 190 mg/dL — ABNORMAL HIGH (ref 70–99)

## 2019-08-02 LAB — URINALYSIS, COMPLETE (UACMP) WITH MICROSCOPIC
Bacteria, UA: NONE SEEN
Bilirubin Urine: NEGATIVE
Glucose, UA: 150 mg/dL — AB
Ketones, ur: NEGATIVE mg/dL
Leukocytes,Ua: NEGATIVE
Nitrite: NEGATIVE
Protein, ur: 30 mg/dL — AB
Specific Gravity, Urine: 1.011 (ref 1.005–1.030)
Squamous Epithelial / HPF: NONE SEEN (ref 0–5)
pH: 5 (ref 5.0–8.0)

## 2019-08-02 LAB — COMPREHENSIVE METABOLIC PANEL
ALT: 12 U/L (ref 0–44)
AST: 28 U/L (ref 15–41)
Albumin: 2.8 g/dL — ABNORMAL LOW (ref 3.5–5.0)
Alkaline Phosphatase: 69 U/L (ref 38–126)
Anion gap: 5 (ref 5–15)
BUN: 17 mg/dL (ref 8–23)
CO2: 27 mmol/L (ref 22–32)
Calcium: 7.3 mg/dL — ABNORMAL LOW (ref 8.9–10.3)
Chloride: 98 mmol/L (ref 98–111)
Creatinine, Ser: 0.95 mg/dL (ref 0.61–1.24)
GFR calc Af Amer: >60 mL/min (ref >60)
GFR calc non Af Amer: >60 mL/min (ref >60)
Glucose, Bld: 119 mg/dL — ABNORMAL HIGH (ref 70–99)
Potassium: 3.9 mmol/L (ref 3.5–5.1)
Sodium: 130 mmol/L — ABNORMAL LOW (ref 135–145)
Total Bilirubin: 0.6 mg/dL (ref 0.3–1.2)
Total Protein: 5.7 g/dL — ABNORMAL LOW (ref 6.5–8.1)

## 2019-08-02 LAB — TROPONIN I (HIGH SENSITIVITY): Troponin I (High Sensitivity): 11 ng/L (ref <18)

## 2019-08-02 LAB — PROCALCITONIN: Procalcitonin: 0.96 ng/mL

## 2019-08-02 MED ORDER — DEXTROSE 10 % IV SOLN: INTRAVENOUS | Status: DC

## 2019-08-02 MED ORDER — GLIPIZIDE 5 MG PO TABS: 5.0000 mg | ORAL_TABLET | Freq: Every day | ORAL | 0 refills | Status: DC

## 2019-08-02 MED ORDER — DEXTROSE 50 % IV SOLN
1.0000 | Freq: Once | INTRAVENOUS | Status: AC
  Administered 2019-08-02: 17:00:00 50 mL via INTRAVENOUS
  Filled 2019-08-02: qty 50

## 2019-08-02 MED ORDER — ALPRAZOLAM 0.5 MG PO TABS
0.5000 mg | ORAL_TABLET | Freq: Once | ORAL | Status: AC
  Administered 2019-08-02: 0.5 mg via ORAL
  Filled 2019-08-02: qty 1

## 2019-08-02 MED ORDER — LIDOCAINE-EPINEPHRINE 2 %-1:100000 IJ SOLN
10.0000 mL | Freq: Once | INTRAMUSCULAR | Status: DC
  Filled 2019-08-02: qty 1

## 2019-08-02 MED ORDER — ENOXAPARIN SODIUM 40 MG/0.4ML ~~LOC~~ SOLN
40.0000 mg | SUBCUTANEOUS | Status: DC
  Administered 2019-08-02: 07:00:00 40 mg via SUBCUTANEOUS
  Filled 2019-08-02: qty 0.4

## 2019-08-02 NOTE — Progress Notes (Signed)
Physical Therapy Evaluation Patient Details Name: Frank Khan MRN: [id number] DOB: May 17, 1935 Today's Date: 08/02/2019   History of Present Illness  PerMD Note:Lamichael D Starace is a 85 y.o. male with medical history significant of hypertension, hyperlipidemia, diabetes mellitus, pulmonary fibrosis on 2 L oxygen at home, TIA, GERD, depression, CAD, stent placement, CKD-3, who presents with difficulty speaking.  Clinical Impression  Patient agrees to PT evaluation. He has 3/5 strength BLE and is MI with bed mobility, MI with transfers sit to stand with RW, and MI with gait with RW 20 feet with 2 L O2 . He is able to transfer to the commode MI with RW and has no skilled PT needs at this time. He needs O2 at home and no skilled PT needs at home.     Follow Up Recommendations No PT follow up(O2)    Equipment Recommendations  Rolling walker with 5" wheels;Other (comment)    Recommendations for Other Services       Precautions / Restrictions Precautions Precautions: Fall Restrictions Weight Bearing Restrictions: No      Mobility  Bed Mobility                  Transfers Overall transfer level: Modified independent Equipment used: Rolling walker (2 wheeled)             General transfer comment: (needs VC for safety)  Ambulation/Gait Ambulation/Gait assistance: Modified independent (Device/Increase time)(2 L O2, saturation above 95%) Gait Distance (Feet): 20 Feet Assistive device: Rolling walker (2 wheeled) Gait Pattern/deviations: Step-to pattern        Stairs            Wheelchair Mobility    Modified Rankin (Stroke Patients Only)       Balance Overall balance assessment: Mild deficits observed, not formally tested                                           Pertinent Vitals/Pain      Home Living Family/patient expects to be discharged to:: Private residence Living Arrangements: Spouse/significant other Available Help at  Discharge: Family   Home Access: Ramped entrance       Home Equipment: Environmental consultant - 2 wheels      Prior Function Level of Independence: Independent with assistive device(s)               Hand Dominance   Dominant Hand: Right    Extremity/Trunk Assessment   Upper Extremity Assessment Upper Extremity Assessment: Overall WFL for tasks assessed    Lower Extremity Assessment Lower Extremity Assessment: Overall WFL for tasks assessed       Communication   Communication: Other (comment)(very difficult to hear patientt, loud neg pressure unit)  Cognition Arousal/Alertness: Awake/alert Behavior During Therapy: Agitated Overall Cognitive Status: Within Functional Limits for tasks assessed                                 General Comments: (Pt annoyed about needing supervision in BR due to fall risk)      General Comments      Exercises     Assessment/Plan    PT Assessment Patent does not need any further PT services  PT Problem List         PT Treatment Interventions      PT Goals (  Current goals can be found in the Care Plan section)  Acute Rehab PT Goals Patient Stated Goal: no goals stated Potential to Achieve Goals: Fair    Frequency     Barriers to discharge        Co-evaluation               AM-PAC PT "6 Clicks" Mobility  Outcome Measure Help needed turning from your back to your side while in a flat bed without using bedrails?: None Help needed moving from lying on your back to sitting on the side of a flat bed without using bedrails?: None Help needed moving to and from a bed to a chair (including a wheelchair)?: None Help needed standing up from a chair using your arms (e.g., wheelchair or bedside chair)?: None Help needed to walk in hospital room?: None Help needed climbing 3-5 steps with a railing? : None 6 Click Score: 24    End of Session Equipment Utilized During Treatment: Gait belt;Oxygen Activity Tolerance:  Patient tolerated treatment well   Nurse Communication: Mobility status PT Visit Diagnosis: Difficulty in walking, not elsewhere classified (R26.2)    Time: 1517-6160 PT Time Calculation (min) (ACUTE ONLY): 50 min   Charges:   PT Evaluation $PT Eval Low Complexity: 1 Low PT Treatments $Gait Training: 8-22 mins $Therapeutic Activity: 8-22 mins          Alanson Puls, PT DPT 08/02/2019, 12:43 PM

## 2019-08-02 NOTE — ED Triage Notes (Signed)
Pt via EMS from home. Pt was recently d/c from the hospital possibly an hour ago. Pt was admitted for hypoglycemia and COVID. Pt had a mechanical fall and hit his head. Pt has a small laceration to his R eyebrow. Pt is on blood thinners but denies LOC. Pt has hx of dementia but is A&Ox4 and NAD at this time.

## 2019-08-02 NOTE — ED Notes (Signed)
Pt O2 dropped to 86% on RA. Placed pt on 2L [place]. Pt O2 increased 94%.

## 2019-08-02 NOTE — ED Provider Notes (Signed)
[facility] Emergency Department Provider Note  ____________________________________________  Time seen: Approximately 4:24 PM  I have reviewed the triage vital signs and the nursing notes.   HISTORY  Chief Complaint Fall    HPI Frank Khan is a 85 y.o. male who presents the emergency department after a syncopal episode, fall, striking his head.  Patient was discharged from the hospital this morning.  Patient had been admitted for hypoglycemia, Covid.  Patient had no complications for Covid, was primarily being admitted for hypoglycemia.  Patient states that he had gone home, attempted to stand up, reportedly passed out, falling and striking his head.  Patient remembers trying to stand up, and the next he remembers was being on the floor.  Patient states that he has a slight headache but no visual changes, no neck pain.  No other musculoskeletal complaints.  Patient denies any chest pain, shortness of breath, domino pain, nausea or vomiting.  Patient did sustain a laceration to the right eyebrow.  Up-to-date on tetanus immunization.  Patient has a history of diabetes, states that typically his sugars run high, not low.  Patient has a history of hypertension, TIA, CKD, hyperlipidemia, GERD.         Past Medical History:  Diagnosis Date  . Diabetes mellitus without complication ([place])   . Hypertension   . Pulmonary fibrosis ([place])   . TIA (transient ischemic attack)     Patient Active Problem List   Diagnosis Date Noted  . TIA (transient ischemic attack)   . Pulmonary fibrosis ([place])   . Hypertension   . Diabetes mellitus without complication ([place])   . Difficulty speaking   . Hypoglycemia   . COVID-19   . CKD (chronic kidney disease), stage III   . HLD (hyperlipidemia)   . GERD (gastroesophageal reflux disease)   . Depression   . CAD (coronary artery disease)     Past Surgical History:  Procedure Laterality Date  . CORONARY ANGIOPLASTY WITH STENT  PLACEMENT      Prior to Admission medications   Medication Sig Start Date End Date Taking? Authorizing Provider  aspirin 81 MG chewable tablet Chew 81 mg by mouth daily.    [provider]  clopidogrel (PLAVIX) 75 MG tablet Take 75 mg by mouth daily. 10/27/18 10/27/19  [provider]  ezetimibe (ZETIA) 10 MG tablet Take 10 mg by mouth daily.    [provider]  glipiZIDE (GLUCOTROL) 5 MG tablet Take 1 tablet (5 mg total) by mouth daily before breakfast. 08/02/19   Fritzi Mandes, MD  losartan (COZAAR) 25 MG tablet Take 25 mg by mouth daily.    [provider]  metoprolol tartrate (LOPRESSOR) 50 MG tablet Take 25 mg by mouth in the morning and at bedtime.    [provider]  mirtazapine (REMERON) 7.5 MG tablet Take 7.5 mg by mouth at bedtime.    [provider]  pantoprazole (PROTONIX) 40 MG tablet Take 40 mg by mouth daily.    [provider]    Allergies Doxycycline, Penicillins, Simvastatin, Chlorhexidine, and Iodine  Family History  Problem Relation Age of Onset  . Hyperlipidemia Sister     Social History Social History   Tobacco Use  . Smoking status: Former Research scientist (life sciences)  . Smokeless tobacco: Never Used  Substance Use Topics  . Alcohol use: No  . Drug use: No     Review of Systems  Constitutional: No fever/chills Eyes: No visual changes. No discharge ENT: No upper  respiratory complaints. Cardiovascular: no chest pain. Respiratory: no cough. No SOB. Gastrointestinal: No abdominal pain.  No nausea, no vomiting.  No diarrhea.  No constipation. Genitourinary: Negative for dysuria. No hematuria Musculoskeletal: Negative for musculoskeletal pain. Skin: Negative for rash, abrasions, lacerations, ecchymosis.  Right eyebrow laceration Neurological: Positive for syncopal episode.  Mild posttraumatic headache but denies focal weakness or numbness. 10-point ROS otherwise  negative.  ____________________________________________   PHYSICAL EXAM:  VITAL SIGNS: ED Triage Vitals [08/02/19 1515]  Enc Vitals Group     BP 109/66     Pulse Rate 88     Resp 18     Temp 97.7 F (36.5 C)     Temp Source Oral     SpO2 93 %     Weight 145 lb 8.1 oz (66 kg)     Height 6' (1.829 m)     Head Circumference      Peak Flow      Pain Score 0     Pain Loc      Pain Edu?      Excl. in GC?      Constitutional: Alert and oriented. Well appearing and in no acute distress. Eyes: Conjunctivae are normal. PERRL. EOMI. Head: 2.5 cm laceration noted to the right eyebrow.  Scabbing in place at this time.  No visible foreign body.  Area is mildly tender to palpation.  No other visible signs of trauma to the skull or face.  No palpable abnormalities or crepitus.  No battle signs, raccoon eyes, serosanguineous fluid drainage from the ears or nares. ENT:      Ears:       Nose: No congestion/rhinnorhea.      Mouth/Throat: Mucous membranes are moist.  Neck: No stridor.  No cervical spine tenderness to palpation  Cardiovascular: Normal rate, regular rhythm. Normal S1 and S2.  Good peripheral circulation. Respiratory: Normal respiratory effort without tachypnea or retractions. Lungs CTAB. Good air entry to the bases with no decreased or absent breath sounds. Gastrointestinal: Bowel sounds 4 quadrants. Soft and nontender to palpation. No guarding or rigidity. No palpable masses. No distention. No CVA tenderness. Musculoskeletal: Full range of motion to all extremities. No gross deformities appreciated.  Patient was initially nontender to palpation over all 4 extremities, moving all joints appropriately.  During his encounter patient began complaining of significant right hip pain.  Patient was tender along the inguinal fold region.  No tenderness to palpation of the greater trochanter region.  No instability of the pelvis.  Patient was still able to move the right lower extremity at  that time.  Dorsalis pedis pulse, sensation intact distally. Neurologic:  Normal speech and language. No gross focal neurologic deficits are appreciated.  Cranial nerves II through XII grossly intact.  Negative pronator drift. Skin:  Skin is warm, dry and intact. No rash noted. Psychiatric: Mood and affect are normal. Speech and behavior are normal. Patient exhibits appropriate insight and judgement.   ____________________________________________   LABS (all labs ordered are listed, but only abnormal results are displayed)  Labs Reviewed  GLUCOSE, CAPILLARY - Abnormal; Notable for the following components:      Result Value   Glucose-Capillary 48 (*)    All other components within normal limits  GLUCOSE, CAPILLARY - Abnormal; Notable for the following components:   Glucose-Capillary 50 (*)    All other components within normal limits  COMPREHENSIVE METABOLIC PANEL - Abnormal; Notable for the following components:   Sodium 130 (*)  Glucose, Bld 119 (*)    Calcium 7.3 (*)    Total Protein 5.7 (*)    Albumin 2.8 (*)    All other components within normal limits  CBC WITH DIFFERENTIAL/PLATELET - Abnormal; Notable for the following components:   WBC 14.4 (*)    Neutro Abs 12.5 (*)    All other components within normal limits  URINALYSIS, COMPLETE (UACMP) WITH MICROSCOPIC - Abnormal; Notable for the following components:   Color, Urine YELLOW (*)    APPearance CLEAR (*)    Glucose, UA 150 (*)    Hgb urine dipstick SMALL (*)    Protein, ur 30 (*)    All other components within normal limits  GLUCOSE, CAPILLARY - Abnormal; Notable for the following components:   Glucose-Capillary 174 (*)    All other components within normal limits  GLUCOSE, CAPILLARY - Abnormal; Notable for the following components:   Glucose-Capillary 157 (*)    All other components within normal limits  GLUCOSE, CAPILLARY - Abnormal; Notable for the following components:   Glucose-Capillary 190 (*)    All  other components within normal limits  CBG MONITORING, ED  TROPONIN I (HIGH SENSITIVITY)   ____________________________________________  EKG   ____________________________________________  RADIOLOGY I personally viewed and evaluated these images as part of my medical decision making, as well as reviewing the written report by the radiologist.  DG Chest 1 View  Result Date: 08/02/2019 CLINICAL DATA:  Syncope, fall, COVID-19 positive EXAM: CHEST  1 VIEW COMPARISON:  Chest radiograph from one day prior. FINDINGS: Stable cardiomediastinal silhouette with normal heart size. No pneumothorax. No pleural effusion. Patchy reticular opacities in both lungs, most prominent at the lung bases, unchanged. No acute consolidative airspace disease. No pulmonary edema. No displaced fractures. IMPRESSION: 1. No acute cardiopulmonary disease. 2. Stable patchy reticular opacities in both lungs, most prominent at the lung bases, compatible with chronic pulmonary fibrosis. Electronically Signed   By: Delbert Phenix M.D.   On: 08/02/2019 17:28   CT Head Wo Contrast  Result Date: 08/02/2019 CLINICAL DATA:  Head trauma, headache Syncope, fall, hit head, R eyebrow lac Fall striking head, less than 1 hour post hospital discharge. EXAM: CT HEAD WITHOUT CONTRAST TECHNIQUE: Contiguous axial images were obtained from the base of the skull through the vertex without intravenous contrast. COMPARISON:  Head CT yesterday. FINDINGS: Brain: No intracranial hemorrhage, mass effect, or midline shift. Stable degree of atrophy and chronic small vessel ischemia. No hydrocephalus. The basilar cisterns are patent. Unchanged right occipital encephalomalacia. No evidence of territorial infarct or acute ischemia. No extra-axial or intracranial fluid collection. Vascular: Atherosclerosis of skullbase vasculature without hyperdense vessel or abnormal calcification. Skull: No fracture or focal lesion. Sinuses/Orbits: Paranasal sinuses and mastoid  air cells are clear. The visualized orbits are unremarkable. Minimal skin irregularity about the right supraorbital soft tissues likely site of laceration. Bilateral cataract resection. Other: None. IMPRESSION: 1. No acute intracranial abnormality. No skull fracture. 2. Unchanged atrophy and chronic small vessel ischemia. Unchanged right occipital encephalomalacia. Electronically Signed   By: Narda Rutherford M.D.   On: 08/02/2019 19:57   CT Cervical Spine Wo Contrast  Result Date: 08/02/2019 CLINICAL DATA:  Choose 1 Polytrauma, critical, head/C-spine injury suspected Mechanical fall striking head approximately 1 hour after discharge for COVID and hypoglycemia. EXAM: CT CERVICAL SPINE WITHOUT CONTRAST TECHNIQUE: Multidetector CT imaging of the cervical spine was performed without intravenous contrast. Multiplanar CT image reconstructions were also generated. COMPARISON:  Cervical spine CT 04/06/2016 FINDINGS: Alignment: Minimal  anterolisthesis of C6 on C7 is unchanged from prior exam. No traumatic subluxation. Skull base and vertebrae: No acute fracture. Vertebral body heights are maintained. The dens and skull base are intact. Soft tissues and spinal canal: No prevertebral fluid or swelling. No visible canal hematoma. Disc levels: Disc space narrowing and endplate spurring at C3-C4, C4-C5, and C5-C6. Mild scattered facet hypertrophy. Upper chest: Emphysema. Biapical pleuroparenchymal scarring. Left apical opacity likely infectious in the setting COVID infection. Other: Carotid calcifications. IMPRESSION: No acute fracture or subluxation of the cervical spine. Multilevel degenerative change. Electronically Signed   By: Narda Rutherford M.D.   On: 08/02/2019 20:05   DG Hip Unilat W or Wo Pelvis 2-3 Views Right  Result Date: 08/02/2019 CLINICAL DATA:  Fall, hip pain EXAM: DG HIP (WITH OR WITHOUT PELVIS) 2-3V RIGHT COMPARISON:  None FINDINGS: The osseous structures appear diffusely demineralized which may limit  detection of small or nondisplaced fractures. Femoral heads are normally located. Proximal femora are intact. Bones of the pelvis are intact and congruent. No abnormal diastatic widening of the symphysis pubis or SI joints. Multilevel degenerative changes are present in the imaged portions of the spine. Vascular calcium noted in the pelvis. Remaining soft tissues are unremarkable. IMPRESSION: 1. Diffuse osseous demineralization which may limit detection of small or nondisplaced fractures. No definite acute fracture or traumatic malalignment seen. Electronically Signed   By: Kreg Shropshire M.D.   On: 08/02/2019 23:29    ____________________________________________    PROCEDURES  Procedure(s) performed:    Marland KitchenMarland KitchenLaceration Repair  Date/Time: 08/02/2019 11:34 PM Performed by: Racheal Patches, PA-C Authorized by: Racheal Patches, PA-C   Consent:    Consent obtained:  Verbal   Consent given by:  Patient   Risks discussed:  Pain Anesthesia (see MAR for exact dosages):    Anesthesia method:  Local infiltration   Local anesthetic:  Lidocaine 1% WITH epi Laceration details:    Location:  Face   Face location:  R eyebrow   Length (cm):  2.5 Repair type:    Repair type:  Simple Exploration:    Hemostasis achieved with:  Direct pressure and epinephrine   Wound extent: no foreign bodies/material noted, no muscle damage noted, no nerve damage noted, no tendon damage noted and no underlying fracture noted     Contaminated: no   Treatment:    Area cleansed with:  Betadine and saline   Amount of cleaning:  Standard Skin repair:    Repair method:  Sutures   Suture size:  5-0   Suture material:  Nylon   Suture technique:  Running locked   Number of sutures:  1 (1 running interlock suture with 8 throws) Approximation:    Approximation:  Close Post-procedure details:    Dressing:  Open (no dressing)   Patient tolerance of procedure:  Tolerated well, no immediate  complications      Medications  dextrose 10 % infusion ( Intravenous Stopped 08/02/19 1805)  lidocaine-EPINEPHrine (XYLOCAINE W/EPI) 2 %-1:100000 (with pres) injection 10 mL (has no administration in time range)  dextrose 50 % solution 50 mL (50 mLs Intravenous Given 08/02/19 1659)     ____________________________________________   INITIAL IMPRESSION / ASSESSMENT AND PLAN / ED COURSE  Pertinent labs & imaging results that were available during my care of the patient were reviewed by me and considered in my medical decision making (see chart for details).  Review of the [place] CSRS was performed in accordance of the [facility] prior to dispensing any  controlled drugs.  Clinical Course as of Aug 02 2339  Wynelle Link Aug 02, 2019  1722 Patient presented to emergency department after syncopal episode causing fall.  Patient did hit his head, sustained a laceration along the right eyebrow.  Patient was just discharged from the hospital for hypoglycemia.  Patient's initial blood glucose was in the 40s, after 2 cups of orange juice distantly improved to 50.  Patient will be given D50, started on D10 drip.  Close monitoring of the patient's blood glucose.  Labs, imaging will also be pursued at this time.   [JC]    Clinical Course User Index [JC] Mahayla Haddaway, Delorise Royals, PA-C          Patient's diagnosis is consistent with hypoglycemia, syncope, fall, eyebrow laceration, COVID-19.  Patient presented to the emergency department after being discharged this morning after experiencing a syncopal episode.  Patient had profound hypoglycemia yesterday, was admitted.  Patient's blood glucose levels were maintaining this morning and he was discharged.  Patient reports that he ate this afternoon, but he stood up, had a syncopal episode falling and hitting his head.  He arrives with a laceration around the right eyebrow.  Patient denied any other complaints at the time.  Initial blood glucose was in the 40s, after drinking 2  bottles of orange juice blood glucose had only improved to 50.  Patient was given D50, started on a D10 drip which eventually improved blood sugar to 170.  D10 drip was stopped at that time.  Blood sugar started to decrease and patient has been given food.  Patient has been drinking coffee with sugar, has eaten a tray of food, drink a soda.  At this time his glucose improved to 190.  While is performing patient's laceration repair he began to complain of sharp right hip pain.  This was imaged.  No acute definitive fracture identified on hip x-ray.  Given the fact that the patient was here for profound hypoglycemia yesterday, went home, ate a meal, still experience hypoglycemia to the point of syncope I feel that patient would benefit from admission.     ____________________________________________  FINAL CLINICAL IMPRESSION(S) / ED DIAGNOSES  Final diagnoses:  Syncope and collapse  Hypoglycemia  COVID-19  Laceration of right eyebrow, initial encounter      NEW MEDICATIONS STARTED DURING THIS VISIT:  ED Discharge Orders    None          This chart was dictated using voice recognition software/Dragon. Despite best efforts to proofread, errors can occur which can change the meaning. Any change was purely unintentional.    Racheal Patches, PA-C 08/02/19 2341    Jene Every, MD 08/10/19 [phone]

## 2019-08-02 NOTE — TOC Progression Note (Signed)
Transition of Care [facility]) - Progression Note    Patient Details  Name: Frank Khan MRN: [id number] Date of Birth: 09/02/1934  [facility] CM/SW Contact  Maud Deed, LCSW Phone Number: 08/02/2019, 1:33 PM  Clinical Narrative:    TOC contacted to arrange transport via EMS.        Expected Discharge Plan and Services           Expected Discharge Date: 08/02/19                                     Social Determinants of Health (SDOH) Interventions    Readmission Risk Interventions No flowsheet data found.

## 2019-08-02 NOTE — ED Notes (Signed)
Pt given 2 cup of OJ. Will reassess CBG momentarily. Pt is A&Ox4 and NAD. Pt has no complaints at this time.

## 2019-08-02 NOTE — Progress Notes (Signed)
Pt passed Stroke swallow Eval

## 2019-08-02 NOTE — Discharge Summary (Signed)
[facility] - [place] [facility]   PATIENT NAME: Frank Khan    MR#:  [id number]  DATE OF BIRTH:  07/13/1934  DATE OF ADMISSION:  08/01/2019 ADMITTING PHYSICIAN: Lorretta Harp, MD  DATE OF DISCHARGE: 08/02/2019  PRIMARY CARE PHYSICIAN: [facility]    ADMISSION DIAGNOSIS:  Hypoglycemia [E16.2] COVID-19 [U07.1]  DISCHARGE DIAGNOSIS:  Hypoglycemia-- resolved COVID-19 infection  SECONDARY DIAGNOSIS:   Past Medical History:  Diagnosis Date  . Diabetes mellitus without complication (HCC)   . Hypertension   . Pulmonary fibrosis (HCC)   . TIA (transient ischemic attack)     HOSPITAL COURSE:   Frank Khan is a 85 y.o. male with medical history significant of hypertension, hyperlipidemia, diabetes mellitus, pulmonary fibrosis on 2 L oxygen at home, TIA, GERD, depression, CAD, stent placement, CKD-3, who presents with difficulty speaking.  Difficulty speaking due to severe hypoglycemia has resolved completely. - Patient does not have unilateral weakness or numbness in extremities, no facial droop or slurred speech.   -  At this moment, low suspicions of  new stroke or TIA. -No neuro deficit.  COVID-19 virus infection: son reports patient had a positive test 2 days ago on Friday 3/12/202 - Chest x-ray does not show infiltration.  Oxygen saturation 92% on home level 2 L nasal cannula oxygen. -Bronchodilators as above -patient asymptomatic. Discussed with son regarding self quarantine at home and symptomatic management.  H/o TIA (transient ischemic attack): -ASA, plavix and zetia  Chronic Pulmonary fibrosis (HCC): on 2L nasal cannula oxygen at home, currently saturation 98% on 2 L nasal cannula oxygen  -Atrovent inhaler, as needed albuterol, as needed Mucinex -chest x-ray does not show any new infiltrate.  HTN:  -Continue home medications: Cozaar, metoprolol -hydralazine prn  Diabetes mellitus without complication (HCC): A1c 7.1, poorly  controlled.  Now has hypoglycemia -according to the son patient does not have frequent hypoglycemia. -Received IV dextrose. Sugars are stable. -Will decrease glipizide 5 mg daily (was on Bid)-- and can be increased later date [facility].  CKD (chronic kidney disease), stage III with h/o type II diabetes   -stable  HLD (hyperlipidemia) -zetia  GERD (gastroesophageal reflux disease) -protonix  Depression -Remeron  CAD (coronary artery disease): No chest pain -Continue aspirin, Plavix, Zetia, BB and losartan  Overall patient is back to baseline. Did very well with physical therapy. Uses walker at home. No falls at home per son.  Will discharge patient to home.NO Home health needs   DVT ppx: SQ Lovenox Code Status: Full code Family Communication: Yes, patient's wife on the phone  and son mike also Disposition Plan: discharge back to previous home environment today Consults called:  none   CONSULTS OBTAINED:    DRUG ALLERGIES:   Allergies  Allergen Reactions  . Doxycycline     [facility] records, reaction not listed   . Penicillins Hives    Has patient had a PCN reaction causing immediate rash, facial/tongue/throat swelling, SOB or lightheadedness with hypotension: yes Has patient had a PCN reaction causing severe rash involving mucus membranes or skin necrosis: no Has patient had a PCN reaction that required hospitalization no Has patient had a PCN reaction occurring within the last 10 years: no If all of the above answers are "NO", then may proceed with Cephalosporin use.    . Simvastatin     Per [facility] records, reaction not given  . Chlorhexidine Itching  . Iodine Rash  DISCHARGE MEDICATIONS:   Allergies as of 08/02/2019      Reactions   Doxycycline    Per [facility] records, reaction not listed    Penicillins Hives   Has patient had a PCN reaction causing immediate rash, facial/tongue/throat swelling, SOB or  lightheadedness with hypotension: yes Has patient had a PCN reaction causing severe rash involving mucus membranes or skin necrosis: no Has patient had a PCN reaction that required hospitalization no Has patient had a PCN reaction occurring within the last 10 years: no If all of the above answers are "NO", then may proceed with Cephalosporin use.   Simvastatin    Per [facility] records, reaction not given   Chlorhexidine Itching   Iodine Rash      Medication List    TAKE these medications   aspirin 81 MG chewable tablet Chew 81 mg by mouth daily.   clopidogrel 75 MG tablet Commonly known as: PLAVIX Take 75 mg by mouth daily.   ezetimibe 10 MG tablet Commonly known as: ZETIA Take 10 mg by mouth daily.   glipiZIDE 5 MG tablet Commonly known as: GLUCOTROL Take 1 tablet (5 mg total) by mouth daily before breakfast. What changed: when to take this   losartan 25 MG tablet Commonly known as: COZAAR Take 25 mg by mouth daily.   metoprolol tartrate 50 MG tablet Commonly known as: LOPRESSOR Take 25 mg by mouth in the morning and at bedtime.   mirtazapine 7.5 MG tablet Commonly known as: REMERON Take 7.5 mg by mouth at bedtime.   pantoprazole 40 MG tablet Commonly known as: PROTONIX Take 40 mg by mouth daily.       If you experience worsening of your admission symptoms, develop shortness of breath, life threatening emergency, suicidal or homicidal thoughts you must seek medical attention immediately by calling 911 or calling your MD immediately  if symptoms less severe.  You Must read complete instructions/literature along with all the possible adverse reactions/side effects for all the Medicines you take and that have been prescribed to you. Take any new Medicines after you have completely understood and accept all the possible adverse reactions/side effects.   Please note  You were cared for by a hospitalist during your hospital stay. If you have any questions about your  discharge medications or the care you received while you were in the hospital after you are discharged, you can call the unit and asked to speak with the hospitalist on call if the hospitalist that took care of you is not available. Once you are discharged, your primary care physician will handle any further medical issues. Please note that NO REFILLS for any discharge medications will be authorized once you are discharged, as it is imperative that you return to your primary care physician (or establish a relationship with a primary care physician if you do not have one) for your aftercare needs so that they can reassess your need for medications and monitor your lab values. Today   SUBJECTIVE   Doing well. Wants to go home  VITAL SIGNS:  Blood pressure (!) 98/41, pulse 88, temperature 98.3 F (36.8 C), temperature source Oral, resp. rate 18, height 6' (1.829 m), weight 66 kg, SpO2 95 %.  I/O:    Intake/Output Summary (Last 24 hours) at 08/02/2019 1230 Last data filed at 08/02/2019 0744 Gross per 24 hour  Intake 600 ml  Output 500 ml  Net 100 ml    PHYSICAL EXAMINATION:  GENERAL:  84 y.o.-year-old patient lying in  the bed with no acute distress. Thin EYES: Pupils equal, round, reactive to light and accommodation. No scleral icterus.  HEENT: Head atraumatic, normocephalic. Oropharynx and nasopharynx clear.  NECK:  Supple, no jugular venous distention. No thyroid enlargement, no tenderness.  LUNGS: distant decreased breath sounds bilaterally bases , no wheezing, rales,rhonchi or crepitation. No use of accessory muscles of respiration.  CARDIOVASCULAR: S1, S2 normal. No murmurs, rubs, or gallops.  ABDOMEN: Soft, non-tender, non-distended. Bowel sounds present. No organomegaly or mass.  EXTREMITIES: No pedal edema, cyanosis, or clubbing.  NEUROLOGIC: Cranial nerves II through XII are intact. Muscle strength 5/5 in all extremities. Sensation intact. Gait not checked.  PSYCHIATRIC: The  patient is alert and oriented x 3.  SKIN: No obvious rash, lesion, or ulcer.   DATA REVIEW:   CBC  Recent Labs  Lab 08/01/19 1317  WBC 5.1  HGB 12.7*  HCT 38.1*  PLT 151    Chemistries  Recent Labs  Lab 08/01/19 1317  NA 132*  K 3.5  CL 98  CO2 27  GLUCOSE 267*  BUN 14  CREATININE 1.29*  CALCIUM 8.2*  AST 32  ALT 14  ALKPHOS 76  BILITOT 0.5    Microbiology Results   Recent Results (from the past 240 hour(s))  Respiratory Panel by RT PCR (Flu A&B, Covid) - Nasopharyngeal Swab     Status: Abnormal   Collection Time: 08/01/19  5:53 PM   Specimen: Nasopharyngeal Swab  Result Value Ref Range Status   SARS Coronavirus 2 by RT PCR POSITIVE (A) NEGATIVE Final    Comment: RESULT CALLED TO, READ BACK BY AND VERIFIED WITH: LEXI OLIVER 08/01/19 AT 2022 HS    Influenza A by PCR NEGATIVE NEGATIVE Final   Influenza B by PCR NEGATIVE NEGATIVE Final    Comment: (NOTE) The Xpert Xpress SARS-CoV-2/FLU/RSV assay is intended as an aid in  the diagnosis of influenza from Nasopharyngeal swab specimens and  should not be used as a sole basis for treatment. Nasal washings and  aspirates are unacceptable for Xpert Xpress SARS-CoV-2/FLU/RSV  testing. Fact Sheet for Patients: https://www.moore.com/ Fact Sheet for Healthcare Providers: https://www.young.biz/ This test is not yet approved or cleared by the [place] FDA and  has been authorized for detection and/or diagnosis of SARS-CoV-2 by  FDA under an Emergency Use Authorization (EUA). This EUA will remain  in effect (meaning this test can be used) for the duration of the  Covid-19 declaration under Section 564(b)(1) of the Act, 21  U.S.C. section 360bbb-3(b)(1), unless the authorization is  terminated or revoked. Performed at [facility], [address] [place], [place] [address]     RADIOLOGY:  CT Head Wo Contrast  Result Date: 08/01/2019 CLINICAL DATA:  Head trauma,  headache, speech difficulty EXAM: CT HEAD WITHOUT CONTRAST TECHNIQUE: Contiguous axial images were obtained from the base of the skull through the vertex without intravenous contrast. COMPARISON:  04/06/2016 FINDINGS: Brain: No evidence of acute infarction, hemorrhage, hydrocephalus, extra-axial collection or mass lesion/mass effect. New, although nonacute encephalomalacia of the right occipital lobe. Periventricular white matter hypodensity. Vascular: No hyperdense vessel or unexpected calcification. Skull: Normal. Negative for fracture or focal lesion. Sinuses/Orbits: No acute finding. Other: None. IMPRESSION: 1. No acute intracranial pathology. 2. New, although nonacute encephalomalacia of the right occipital lobe when compared to prior CT dated 2017. 3. Small-vessel white matter disease. Electronically Signed   By: Lauralyn Primes M.D.   On: 08/01/2019 14:11   DG Chest Portable 1 View  Result  Date: 08/01/2019 CLINICAL DATA:  Trouble speaking this morning. EXAM: PORTABLE CHEST 1 VIEW COMPARISON:  04/06/2016 FINDINGS: Cardiac silhouette is normal in size. No mediastinal or hilar masses. Lungs are hyperexpanded. There are chronic changes of parenchymal fibrosis that are stable. No evidence of pneumonia or pulmonary edema. No pleural effusion or pneumothorax. Skeletal structures are grossly intact. IMPRESSION: 1. No acute cardiopulmonary disease. 2. Chronic lung changes consistent with pulmonary fibrosis. Electronically Signed   By: Amie Portland M.D.   On: 08/01/2019 13:57     CODE STATUS:    TOTAL TIME TAKING CARE OF THIS PATIENT: *35* minutes.    Enedina Finner M.D  [facility]    CC: Primary care physician; [facility]

## 2019-08-03 DIAGNOSIS — E162 Hypoglycemia, unspecified: Secondary | ICD-10-CM | POA: Diagnosis not present

## 2019-08-03 DIAGNOSIS — E785 Hyperlipidemia, unspecified: Secondary | ICD-10-CM

## 2019-08-03 DIAGNOSIS — I2510 Atherosclerotic heart disease of native coronary artery without angina pectoris: Secondary | ICD-10-CM

## 2019-08-03 DIAGNOSIS — U071 COVID-19: Secondary | ICD-10-CM

## 2019-08-03 DIAGNOSIS — N1831 Chronic kidney disease, stage 3a: Secondary | ICD-10-CM

## 2019-08-03 DIAGNOSIS — J8410 Pulmonary fibrosis, unspecified: Secondary | ICD-10-CM

## 2019-08-03 DIAGNOSIS — K219 Gastro-esophageal reflux disease without esophagitis: Secondary | ICD-10-CM

## 2019-08-03 DIAGNOSIS — I2583 Coronary atherosclerosis due to lipid rich plaque: Secondary | ICD-10-CM

## 2019-08-03 DIAGNOSIS — I10 Essential (primary) hypertension: Secondary | ICD-10-CM

## 2019-08-03 DIAGNOSIS — F329 Major depressive disorder, single episode, unspecified: Secondary | ICD-10-CM

## 2019-08-03 DIAGNOSIS — G459 Transient cerebral ischemic attack, unspecified: Secondary | ICD-10-CM

## 2019-08-03 DIAGNOSIS — E119 Type 2 diabetes mellitus without complications: Secondary | ICD-10-CM

## 2019-08-03 LAB — BASIC METABOLIC PANEL
Anion gap: 10 (ref 5–15)
BUN: 15 mg/dL (ref 8–23)
CO2: 28 mmol/L (ref 22–32)
Calcium: 8.5 mg/dL — ABNORMAL LOW (ref 8.9–10.3)
Chloride: 93 mmol/L — ABNORMAL LOW (ref 98–111)
Creatinine, Ser: 1.02 mg/dL (ref 0.61–1.24)
GFR calc Af Amer: >60 mL/min (ref >60)
GFR calc non Af Amer: >60 mL/min (ref >60)
Glucose, Bld: 137 mg/dL — ABNORMAL HIGH (ref 70–99)
Potassium: 4.1 mmol/L (ref 3.5–5.1)
Sodium: 131 mmol/L — ABNORMAL LOW (ref 135–145)

## 2019-08-03 LAB — CBC
HCT: 42.3 % (ref 39.0–52.0)
Hemoglobin: 13.9 g/dL (ref 13.0–17.0)
MCH: 29.3 pg (ref 26.0–34.0)
MCHC: 32.9 g/dL (ref 30.0–36.0)
MCV: 89.1 fL (ref 80.0–100.0)
Platelets: 151 10*3/uL (ref 150–400)
RBC: 4.75 MIL/uL (ref 4.22–5.81)
RDW: 13 % (ref 11.5–15.5)
WBC: 10.3 10*3/uL (ref 4.0–10.5)
nRBC: 0 % (ref 0.0–0.2)

## 2019-08-03 LAB — GLUCOSE, CAPILLARY
Glucose-Capillary: 173 mg/dL — ABNORMAL HIGH (ref 70–99)
Glucose-Capillary: 129 mg/dL — ABNORMAL HIGH (ref 70–99)
Glucose-Capillary: 105 mg/dL — ABNORMAL HIGH (ref 70–99)
Glucose-Capillary: 140 mg/dL — ABNORMAL HIGH (ref 70–99)
Glucose-Capillary: 173 mg/dL — ABNORMAL HIGH (ref 70–99)
Glucose-Capillary: 141 mg/dL — ABNORMAL HIGH (ref 70–99)
Glucose-Capillary: 118 mg/dL — ABNORMAL HIGH (ref 70–99)

## 2019-08-03 LAB — HEMOGLOBIN A1C
Hgb A1c MFr Bld: 5.8 % — ABNORMAL HIGH (ref 4.8–5.6)
Mean Plasma Glucose: 119.76 mg/dL

## 2019-08-03 MED ORDER — METOPROLOL TARTRATE 25 MG PO TABS
25.0000 mg | ORAL_TABLET | Freq: Two times a day (BID) | ORAL | Status: DC
  Administered 2019-08-03 (×2): 25 mg via ORAL
  Filled 2019-08-03 (×2): qty 1

## 2019-08-03 MED ORDER — CLOPIDOGREL BISULFATE 75 MG PO TABS
75.0000 mg | ORAL_TABLET | Freq: Every day | ORAL | Status: DC
  Administered 2019-08-03: 75 mg via ORAL
  Filled 2019-08-03: qty 1

## 2019-08-03 MED ORDER — PANTOPRAZOLE SODIUM 40 MG PO TBEC
40.0000 mg | DELAYED_RELEASE_TABLET | Freq: Every day | ORAL | Status: DC
  Administered 2019-08-03: 09:00:00 40 mg via ORAL
  Filled 2019-08-03: qty 1

## 2019-08-03 MED ORDER — ACETAMINOPHEN 325 MG PO TABS: 650.0000 mg | ORAL_TABLET | Freq: Four times a day (QID) | ORAL | Status: DC | PRN

## 2019-08-03 MED ORDER — MIRTAZAPINE 15 MG PO TABS: 7.5000 mg | ORAL_TABLET | Freq: Every day | ORAL | Status: DC

## 2019-08-03 MED ORDER — TRAMADOL HCL 50 MG PO TABS
50.0000 mg | ORAL_TABLET | Freq: Four times a day (QID) | ORAL | Status: DC | PRN
  Administered 2019-08-03: 06:00:00 50 mg via ORAL
  Filled 2019-08-03: qty 1

## 2019-08-03 MED ORDER — ENOXAPARIN SODIUM 40 MG/0.4ML ~~LOC~~ SOLN
40.0000 mg | SUBCUTANEOUS | Status: DC
  Administered 2019-08-03: 06:00:00 40 mg via SUBCUTANEOUS
  Filled 2019-08-03: qty 0.4

## 2019-08-03 MED ORDER — LOSARTAN POTASSIUM 25 MG PO TABS
25.0000 mg | ORAL_TABLET | Freq: Every day | ORAL | Status: DC
  Administered 2019-08-03: 09:00:00 25 mg via ORAL
  Filled 2019-08-03: qty 1

## 2019-08-03 MED ORDER — ENOXAPARIN SODIUM 40 MG/0.4ML ~~LOC~~ SOLN: 40.0000 mg | SUBCUTANEOUS | Status: DC

## 2019-08-03 MED ORDER — ASPIRIN 81 MG PO CHEW
81.0000 mg | CHEWABLE_TABLET | Freq: Every day | ORAL | Status: DC
  Administered 2019-08-03: 09:00:00 81 mg via ORAL
  Filled 2019-08-03: qty 1

## 2019-08-03 MED ORDER — KETOROLAC TROMETHAMINE 30 MG/ML IJ SOLN: 15.0000 mg | Freq: Four times a day (QID) | INTRAMUSCULAR | Status: DC | PRN

## 2019-08-03 MED ORDER — EZETIMIBE 10 MG PO TABS
10.0000 mg | ORAL_TABLET | Freq: Every day | ORAL | Status: DC
  Administered 2019-08-03: 09:00:00 10 mg via ORAL
  Filled 2019-08-03: qty 1

## 2019-08-03 NOTE — Discharge Summary (Signed)
[facility] [place] [facility]   PATIENT NAME: Frank Khan    MR#:  [id number]  DATE OF BIRTH:  June 16, 1934  DATE OF ADMISSION:  08/02/2019 ADMITTING PHYSICIAN: Anselm Jungling, DO  DATE OF DISCHARGE: 08/03/2019  PRIMARY CARE PHYSICIAN: [facility]    ADMISSION DIAGNOSIS:  Syncope and collapse [R55] Hypoglycemia [E16.2] Laceration of right eyebrow, initial encounter [S01.111A] COVID-19 [U07.1]  DISCHARGE DIAGNOSIS:  syncope due to hypoglycemia-- resolved laceration above right eyebrow COVID-19 infection  SECONDARY DIAGNOSIS:   Past Medical History:  Diagnosis Date  . Diabetes mellitus without complication (HCC)   . Hypertension   . Pulmonary fibrosis (HCC)   . TIA (transient ischemic attack)     HOSPITAL COURSE:  Frank Khan is a 85 y.o. male with medical history significant for CAD, HTN, TIA, pulmonary fibrosis on 2L, CAD s/p stent, Type 2 diabetes, CKD stage 3, BPH and hx of COVID infection who presents with concerns of syncope.  On EMS arrival he was found to have glucose of 40 and only increase to 50 after drinking orange drinks. He was given D50 and then ultimately started on D10 infusion  Syncope due to hypoglycemia in the setting of Type 2 diabetes -pt was just discharged with the same yesterday -recieved IV D10 infusion--now d/ced sugars ~140  -discontinuation of glipizide  -A1c 5.8 -no acute injury other than superficial laceration above right eyebrow, no bleeding -tele-NSR with mild tachy 100's -dietitian consultation done. Dietitian also called and spoke with patient's wife. -This with patient's son Frank Khan on the phone to ensure patient does not take his diabetes medicine, check his sugars, ensure he eats regularly, and follow-up with PCP at [facility]  COVID-19 virus infection reportedly had a positive test 2 days ago on Friday 3/12/202 Chest x-ray does not show infiltration.  No respiratory distress recommend self quarantine  for 10 days  H/o TIA  ASA, plavix and zetia  Chronic Pulmonary fibrosis  on 2L nasal cannula oxygen at home -cont inhalers  HTN Continue home medications:Cozaar, metoprolol  CKD  stage III  stable  HLD  zetia  GERD  protonix  Depression Remeron  CAD  No chest pain Continue aspirin, Plavix, Zetia, BB and losartan    DVT prophylaxis:.Lovenox Code Status: Full Family Communication: Plan discussed with son [name] on the phone times two disposition Plan: Home   Patient had physical therapy done yesterday did very well. No PT was recommended at discharge CONSULTS OBTAINED:    DRUG ALLERGIES:   Allergies  Allergen Reactions  . Doxycycline     [facility] records, reaction not listed   . Penicillins Hives    Has patient had a PCN reaction causing immediate rash, facial/tongue/throat swelling, SOB or lightheadedness with hypotension: yes Has patient had a PCN reaction causing severe rash involving mucus membranes or skin necrosis: no Has patient had a PCN reaction that required hospitalization no Has patient had a PCN reaction occurring within the last 10 years: no If all of the above answers are "NO", then may proceed with Cephalosporin use.    . Simvastatin     Per [facility] records, reaction not given  . Chlorhexidine Itching  . Iodine Rash    DISCHARGE MEDICATIONS:   Allergies as of 08/03/2019      Reactions   Doxycycline    Per [facility] records, reaction not listed    Penicillins Hives   Has patient had a PCN reaction causing immediate rash, facial/tongue/throat  swelling, SOB or lightheadedness with hypotension: yes Has patient had a PCN reaction causing severe rash involving mucus membranes or skin necrosis: no Has patient had a PCN reaction that required hospitalization no Has patient had a PCN reaction occurring within the last 10 years: no If all of the above answers are "NO", then may proceed with Cephalosporin use.   Simvastatin    Per [facility] records,  reaction not given   Chlorhexidine Itching   Iodine Rash      Medication List    STOP taking these medications   glipiZIDE 5 MG tablet Commonly known as: GLUCOTROL     TAKE these medications   aspirin 81 MG chewable tablet Chew 81 mg by mouth daily.   clopidogrel 75 MG tablet Commonly known as: PLAVIX Take 75 mg by mouth daily.   ezetimibe 10 MG tablet Commonly known as: ZETIA Take 10 mg by mouth daily.   losartan 25 MG tablet Commonly known as: COZAAR Take 25 mg by mouth daily.   metoprolol tartrate 50 MG tablet Commonly known as: LOPRESSOR Take 25 mg by mouth in the morning and at bedtime.   mirtazapine 7.5 MG tablet Commonly known as: REMERON Take 7.5 mg by mouth at bedtime.   pantoprazole 40 MG tablet Commonly known as: PROTONIX Take 40 mg by mouth daily.       If you experience worsening of your admission symptoms, develop shortness of breath, life threatening emergency, suicidal or homicidal thoughts you must seek medical attention immediately by calling 911 or calling your MD immediately  if symptoms less severe.  You Must read complete instructions/literature along with all the possible adverse reactions/side effects for all the Medicines you take and that have been prescribed to you. Take any new Medicines after you have completely understood and accept all the possible adverse reactions/side effects.   Please note  You were cared for by a hospitalist during your hospital stay. If you have any questions about your discharge medications or the care you received while you were in the hospital after you are discharged, you can call the unit and asked to speak with the hospitalist on call if the hospitalist that took care of you is not available. Once you are discharged, your primary care physician will handle any further medical issues. Please note that NO REFILLS for any discharge medications will be authorized once you are discharged, as it is imperative that  you return to your primary care physician (or establish a relationship with a primary care physician if you do not have one) for your aftercare needs so that they can reassess your need for medications and monitor your lab values. Today   SUBJECTIVE   No new complaints. Picky about eating hospital food  VITAL SIGNS:  Blood pressure 128/63, pulse 96, temperature 98.4 F (36.9 C), temperature source Oral, resp. rate 19, height 6' (1.829 m), weight 66 kg, SpO2 96 %.  I/O:    Intake/Output Summary (Last 24 hours) at 08/03/2019 1455 Last data filed at 08/03/2019 1349 Gross per 24 hour  Intake 249.66 ml  Output 125 ml  Net 124.66 ml    PHYSICAL EXAMINATION:  GENERAL:  85 y.o.-year-old patient lying in the bed with no acute distress. thin EYES: Pupils equal, round, reactive to light and accommodation. No scleral icterus.  HEENT: Head atraumatic, normocephalic. Oropharynx and nasopharynx clear.  NECK:  Supple, no jugular venous distention. No thyroid enlargement, no tenderness.  LUNGS: distant breath sounds bilaterally, no wheezing, rales,rhonchi or  crepitation. No use of accessory muscles of respiration.  CARDIOVASCULAR: S1, S2 normal. No murmurs, rubs, or gallops.  ABDOMEN: Soft, non-tender, non-distended. Bowel sounds present. No organomegaly or mass.  EXTREMITIES: No pedal edema, cyanosis, or clubbing.  NEUROLOGIC: Cranial nerves II through XII are intact. Muscle strength 5/5 in all extremities. Sensation intact. Gait not checked.  PSYCHIATRIC:  patient is alert and oriented x 3.  SKIN: No obvious rash, lesion, or ulcer.   DATA REVIEW:   CBC  Recent Labs  Lab 08/03/19 0632  WBC 10.3  HGB 13.9  HCT 42.3  PLT 151    Chemistries  Recent Labs  Lab 08/02/19 1702 08/02/19 1702 08/03/19 0632  NA 130*   < > 131*  K 3.9   < > 4.1  CL 98   < > 93*  CO2 27   < > 28  GLUCOSE 119*   < > 137*  BUN 17   < > 15  CREATININE 0.95   < > 1.02  CALCIUM 7.3*   < > 8.5*  AST 28  --    --   ALT 12  --   --   ALKPHOS 69  --   --   BILITOT 0.6  --   --    < > = values in this interval not displayed.    Microbiology Results   Recent Results (from the past 240 hour(s))  Respiratory Panel by RT PCR (Flu A&B, Covid) - Nasopharyngeal Swab     Status: Abnormal   Collection Time: 08/01/19  5:53 PM   Specimen: Nasopharyngeal Swab  Result Value Ref Range Status   SARS Coronavirus 2 by RT PCR POSITIVE (A) NEGATIVE Final    Comment: RESULT CALLED TO, READ BACK BY AND VERIFIED WITH: LEXI OLIVER 08/01/19 AT 2022 HS    Influenza A by PCR NEGATIVE NEGATIVE Final   Influenza B by PCR NEGATIVE NEGATIVE Final    Comment: (NOTE) The Xpert Xpress SARS-CoV-2/FLU/RSV assay is intended as an aid in  the diagnosis of influenza from Nasopharyngeal swab specimens and  should not be used as a sole basis for treatment. Nasal washings and  aspirates are unacceptable for Xpert Xpress SARS-CoV-2/FLU/RSV  testing. Fact Sheet for Patients: https://www.moore.com/ Fact Sheet for Healthcare Providers: https://www.young.biz/ This test is not yet approved or cleared by the [place] FDA and  has been authorized for detection and/or diagnosis of SARS-CoV-2 by  FDA under an Emergency Use Authorization (EUA). This EUA will remain  in effect (meaning this test can be used) for the duration of the  Covid-19 declaration under Section 564(b)(1) of the Act, 21  U.S.C. section 360bbb-3(b)(1), unless the authorization is  terminated or revoked. Performed at [facility], [address] [place], [place] [address]     RADIOLOGY:  DG Chest 1 View  Result Date: 08/02/2019 CLINICAL DATA:  Syncope, fall, COVID-19 positive EXAM: CHEST  1 VIEW COMPARISON:  Chest radiograph from one day prior. FINDINGS: Stable cardiomediastinal silhouette with normal heart size. No pneumothorax. No pleural effusion. Patchy reticular opacities in both lungs, most prominent at  the lung bases, unchanged. No acute consolidative airspace disease. No pulmonary edema. No displaced fractures. IMPRESSION: 1. No acute cardiopulmonary disease. 2. Stable patchy reticular opacities in both lungs, most prominent at the lung bases, compatible with chronic pulmonary fibrosis. Electronically Signed   By: Delbert Phenix M.D.   On: 08/02/2019 17:28   CT Head Wo Contrast  Result Date: 08/02/2019 CLINICAL DATA:  Head trauma, headache Syncope, fall, hit head, R eyebrow lac Fall striking head, less than 1 hour post hospital discharge. EXAM: CT HEAD WITHOUT CONTRAST TECHNIQUE: Contiguous axial images were obtained from the base of the skull through the vertex without intravenous contrast. COMPARISON:  Head CT yesterday. FINDINGS: Brain: No intracranial hemorrhage, mass effect, or midline shift. Stable degree of atrophy and chronic small vessel ischemia. No hydrocephalus. The basilar cisterns are patent. Unchanged right occipital encephalomalacia. No evidence of territorial infarct or acute ischemia. No extra-axial or intracranial fluid collection. Vascular: Atherosclerosis of skullbase vasculature without hyperdense vessel or abnormal calcification. Skull: No fracture or focal lesion. Sinuses/Orbits: Paranasal sinuses and mastoid air cells are clear. The visualized orbits are unremarkable. Minimal skin irregularity about the right supraorbital soft tissues likely site of laceration. Bilateral cataract resection. Other: None. IMPRESSION: 1. No acute intracranial abnormality. No skull fracture. 2. Unchanged atrophy and chronic small vessel ischemia. Unchanged right occipital encephalomalacia. Electronically Signed   By: Keith Rake M.D.   On: 08/02/2019 19:57   CT Cervical Spine Wo Contrast  Result Date: 08/02/2019 CLINICAL DATA:  Choose 1 Polytrauma, critical, head/C-spine injury suspected Mechanical fall striking head approximately 1 hour after discharge for COVID and hypoglycemia. EXAM: CT CERVICAL  SPINE WITHOUT CONTRAST TECHNIQUE: Multidetector CT imaging of the cervical spine was performed without intravenous contrast. Multiplanar CT image reconstructions were also generated. COMPARISON:  Cervical spine CT 04/06/2016 FINDINGS: Alignment: Minimal anterolisthesis of C6 on C7 is unchanged from prior exam. No traumatic subluxation. Skull base and vertebrae: No acute fracture. Vertebral body heights are maintained. The dens and skull base are intact. Soft tissues and spinal canal: No prevertebral fluid or swelling. No visible canal hematoma. Disc levels: Disc space narrowing and endplate spurring at Y1-O1, C4-C5, and C5-C6. Mild scattered facet hypertrophy. Upper chest: Emphysema. Biapical pleuroparenchymal scarring. Left apical opacity likely infectious in the setting COVID infection. Other: Carotid calcifications. IMPRESSION: No acute fracture or subluxation of the cervical spine. Multilevel degenerative change. Electronically Signed   By: Keith Rake M.D.   On: 08/02/2019 20:05   DG Hip Unilat W or Wo Pelvis 2-3 Views Right  Result Date: 08/02/2019 CLINICAL DATA:  Fall, hip pain EXAM: DG HIP (WITH OR WITHOUT PELVIS) 2-3V RIGHT COMPARISON:  None FINDINGS: The osseous structures appear diffusely demineralized which may limit detection of small or nondisplaced fractures. Femoral heads are normally located. Proximal femora are intact. Bones of the pelvis are intact and congruent. No abnormal diastatic widening of the symphysis pubis or SI joints. Multilevel degenerative changes are present in the imaged portions of the spine. Vascular calcium noted in the pelvis. Remaining soft tissues are unremarkable. IMPRESSION: 1. Diffuse osseous demineralization which may limit detection of small or nondisplaced fractures. No definite acute fracture or traumatic malalignment seen. Electronically Signed   By: Lovena Le M.D.   On: 08/02/2019 23:29     CODE STATUS:     Code Status Orders  (From admission,  onward)         Start     Ordered   08/03/19 0038  Full code  Continuous     08/03/19 0038        Code Status History    This patient has a current code status but no historical code status.   Advance Care Planning Activity    Advance Directive Documentation     Most Recent Value  Type of Advance Directive  Healthcare Power of Attorney  Pre-existing out of facility DNR order (  yellow form or pink MOST form)  --  "MOST" Form in Place?  --       TOTAL TIME TAKING CARE OF THIS PATIENT: *35* minutes.    Enedina Finner M.D  [facility]    CC: Primary care physician; [facility]

## 2019-08-03 NOTE — ED Notes (Signed)
Primary contact is pts son Talvin Christianson [phone]

## 2019-08-03 NOTE — Progress Notes (Signed)
Pt now endorses we are to contact "Kathlene November; Rafael Bihari" as this is his POA. Clarified with pt his preferred contact is "Kathlene November" not Sherrine Maples as previously endorsed

## 2019-08-03 NOTE — Progress Notes (Signed)
Pt refuses posterior skin assessment and anterior skin assessment from waste down.

## 2019-08-03 NOTE — ED Notes (Signed)
Pt assisted to stand and use urinal by NT [place]. NT reported that pts O2 sat dropped to 82% while standing. He returned to bed safely and she increased O2 to 4L via nasal cannula after contacting this nurse for approval.

## 2019-08-03 NOTE — ED Notes (Signed)
Spoke with patients son Sherrine Maples and gave update on patients condition as well as information about his admission to 1C. He will call his father in the morning to check on him.

## 2019-08-03 NOTE — TOC Initial Note (Signed)
Transition of Care [facility]) - Initial/Assessment Note    Patient Details  Name: Frank Khan MRN: [id number] Date of Birth: [birth year]  [facility]) CM/SW Contact:    Allayne Butcher, RN Phone Number: 08/03/2019, 11:26 AM  Clinical Narrative:                 Patient is from home where he lives with his wife.  Patient placed in observation for hypoglycemia, ready for discharge home today.  Patient will benefit from home health services for nursing.  [facility] has accepted home health referral for RN.  No other discharge needs identified at this time.   Expected Discharge Plan: Home w Home Health Services Barriers to Discharge: Barriers Resolved   Patient Goals and CMS Choice   CMS Medicare.gov Compare Post Acute Care list provided to:: Patient Represenative (must comment) Choice offered to / list presented to : Adult Children(son)  Expected Discharge Plan and Services Expected Discharge Plan: Home w Home Health Services   Discharge Planning Services: CM Consult Post Acute Care Choice: Home Health Living arrangements for the past 2 months: Single Family Home Expected Discharge Date: 08/04/19                         HH Arranged: RN HH Agency: [facility] Date HH Agency Contacted: 08/03/19 Time HH Agency Contacted: 1122 Representative spoke with at [facility] Agency: Becky Sax  Prior Living Arrangements/Services Living arrangements for the past 2 months: Single Family Home Lives with:: Spouse Patient language and need for interpreter reviewed:: No Do you feel safe going back to the place where you live?: Yes      Need for Family Participation in Patient Care: Yes (Comment) Care giver support system in place?: Yes (comment)(wife and son)   Criminal Activity/Legal Involvement Pertinent to Current Situation/Hospitalization: No - Comment as needed  Activities of Daily Living Home Assistive Devices/Equipment: Environmental consultant (specify type), Wheelchair ADL  Screening (condition at time of admission) Patient's cognitive ability adequate to safely complete daily activities?: No Is the patient deaf or have difficulty hearing?: No Does the patient have difficulty seeing, even when wearing glasses/contacts?: No Does the patient have difficulty concentrating, remembering, or making decisions?: No Patient able to express need for assistance with ADLs?: Yes Does the patient have difficulty dressing or bathing?: Yes Independently performs ADLs?: No Communication: Appropriate for developmental age In/Out Bed: Needs assistance Walks in Home: Independent with device (comment) Weakness of Legs: Both Weakness of Arms/Hands: Both  Permission Sought/Granted Permission sought to share information with : Case Manager, Family Supports Permission granted to share information with : Yes, Verbal Permission Granted     Permission granted to share info w AGENCY: [facility]  Permission granted to share info w Relationship: son and wife     Emotional Assessment       Orientation: : Oriented to Self, Oriented to Place, Oriented to  Time, Oriented to Situation Alcohol / Substance Use: Not Applicable Psych Involvement: No (comment)  Admission diagnosis:  Syncope and collapse [R55] Hypoglycemia [E16.2] Laceration of right eyebrow, initial encounter [S01.111A] COVID-19 [U07.1] Patient Active Problem List   Diagnosis Date Noted  . TIA (transient ischemic attack)   . Pulmonary fibrosis (HCC)   . Hypertension   . Diabetes mellitus without complication (HCC)   . Difficulty speaking   . Hypoglycemia   . COVID-19   . CKD (chronic kidney disease), stage III   . HLD (hyperlipidemia)   .  GERD (gastroesophageal reflux disease)   . Depression   . CAD (coronary artery disease)    PCP:  [facility], [place], [place] [address] Phone: [phone] Fax:  [phone]     Social Determinants of Health (SDOH) Interventions    Readmission Risk Interventions No flowsheet data found.

## 2019-08-03 NOTE — TOC Progression Note (Signed)
Transition of Care [facility]) - Progression Note    Patient Details  Name: Frank Khan MRN: [id number] Date of Birth: Mar 10, 1935  Transition of Care [facility]) CM/SW Contact  Allayne Butcher, RN Phone Number: 08/03/2019, 1:43 PM  Clinical Narrative:     Patient's family agrees to home health services.  This RNCM called the [facility] to start insurance authorization for home health services.  [facility] reports that the patient has not been to see his PCP in over 9 months and he needs to schedule an appointment before they will approve home health services.  Patient's son Kathlene November reports that he will call the [place] and schedule an appointment today.  Sherrine Maples, Mike's brother will be coming to pick the patient up this afternoon when ready for discharge.   Expected Discharge Plan: Home w Home Health Services Barriers to Discharge: Barriers Resolved  Expected Discharge Plan and Services Expected Discharge Plan: Home w Home Health Services   Discharge Planning Services: CM Consult Post Acute Care Choice: Home Health Living arrangements for the past 2 months: Single Family Home Expected Discharge Date: 08/04/19                         HH Arranged: RN HH Agency: [facility] Date HH Agency Contacted: 08/03/19 Time HH Agency Contacted: 1122 Representative spoke with at [facility] Agency: Becky Sax   Social Determinants of Health (SDOH) Interventions    Readmission Risk Interventions No flowsheet data found.

## 2019-08-03 NOTE — Progress Notes (Signed)
CBG failed to flow over result of 127 per NT, Frank Khan.

## 2019-08-03 NOTE — Discharge Instructions (Signed)
Advised not to take his diabetes medicine keep log of your sugars at home advised not to skip any meals

## 2019-08-03 NOTE — Discharge Planning (Addendum)
IV removed.  RN assessment and VS revealed stability for DC to home.  Discharge papers given, explained and educated.  CBGs stable prior to DC. Discharging on 2LNC (Baseline at home) No scripts needed at this time. Dietician spoke to patient and educated on importance of NOT skipping meals.  Once ready, will be wheeled to front and family transporting home via car.

## 2019-08-03 NOTE — Progress Notes (Addendum)
Pt RR 30 with SpO2 of 88. On 4L [place] which was removed by pt. Pt refusing to O2 as of current. Pt refuses to prone. Pt refuses to turn cough deep breath. Pt demanding pain medication. NP previous contacted. Pain medication ordered and awaiting verification at time of demand. When attempting to explain rationale of O2 Pt put finger in this RN face and shouted "You're going to do as I tell you to do". This RN placed Delight in reach of pt and backed out of striking distance of pt. Once pt calmed down I explained I would return with pain medication once medication available to administer.

## 2019-08-03 NOTE — Plan of Care (Addendum)
Nutrition Education Note   RD consulted for nutrition education regarding recurrent hypoglycemia   Lab Results  Component Value Date   HGBA1C 5.8 (H) 08/03/2019   Spoke with pt via phone today. Pt mumbling and hard to understand but RN at bedside to reinforce education. RD provided brief DM diet education with pt today focusing mainly on eating consistently throughout the day and not skipping meals. Pt reports that he usually eats 2 meals per day at home. RD recommend a piece of fruit of a protein supplement such as Boost or Ensure at times when he is not hungry or wants to skip a meal. Pt is thin and with pulmonary fibrosis so he would benefit from drinking 2-3 Ensure per day at baseline. RN at bedside asked to reinforce information to patient.   RD also spoke with pt's wife Malachi Bonds via the phone. Recommend Ensure supplements daily. Wife reports that he was drinking Ensure at one time but that he had gotten tired of them. RD recommended changing out brands and flavors to help keep if from getting tired of the supplements.   Pt to discharge today.   Pt voiced understanding   Expect poor compliance.  Body mass index is 19.73 kg/m. Pt meets criteria for underweight for age based on current BMI.  No further nutrition interventions warranted at this time. RD contact information provided. If additional nutrition issues arise, please re-consult RD.  Betsey Holiday MS, RD, LDN Contact information available in Amion

## 2019-08-03 NOTE — H&P (Addendum)
History and Physical    Frank Khan:[id number] DOB: 1934/12/27 DOA: 08/02/2019  PCP: [facility] [place]  Patient coming from: Home, lives with wife  I have personally briefly reviewed patient's old medical records in [place]  Chief Complaint: syncope  HPI: Frank Khan is a 85 y.o. male with medical history significant for CAD, HTN, TIA, pulmonary fibrosis on 2L, CAD s/p stent, Type 2 diabetes, CKD stage 3, BPH and hx of COVID infection who presents with concerns of syncope.   He was just discharged home yesterday afternoon for hypoglycemia and had his glipizide decrease from BID to 5mg  daily. He then returned home and was trying to stand up from his stair when he fell hitting his head. States that he did not loss consciousness and was not dizzy. He did not take any of his medications including his diabetic medications.   On EMS arrival he was found to have glucose of 40 and only increase to 50 after drinking orange drinks. He was given D50 and then ultimately started on D10 infusion.  He was afrebile, normotensive on home 2L. WBC of 14.4. Na of 130. Elevated PCT of 0.96. COVID test was positive 08/01/2019.  Negative CT head and CT cervical spine.  Negative CXR for acute process.  Review of Systems:  Unable to fully obtain. Pt was alert and oriented x 4 but had poor recall of symptoms.  Past Medical History:  Diagnosis Date  . Diabetes mellitus without complication ([place])   . Hypertension   . Pulmonary fibrosis ([place])   . TIA (transient ischemic attack)     Past Surgical History:  Procedure Laterality Date  . CORONARY ANGIOPLASTY WITH STENT PLACEMENT       reports that he has quit smoking. He has never used smokeless tobacco. He reports that he does not drink alcohol or use drugs.  Allergies  Allergen Reactions  . Doxycycline     [facility], [facility] not [facility]   . Penicillins Hives    Has patient had a PCN reaction causing immediate rash,  facial/tongue/throat swelling, SOB or lightheadedness with hypotension: yes Has patient had a PCN reaction causing severe rash involving mucus membranes or skin necrosis: no Has patient had a PCN reaction that required hospitalization no Has patient had a PCN reaction occurring within the last 10 years: no If all of the above answers are "NO", then may proceed with Cephalosporin use.    . Simvastatin     [facility] records, reaction not given  . Chlorhexidine Itching  . Iodine Rash    Family History  Problem Relation Age of Onset  . Hyperlipidemia Sister      Prior to Admission medications   Medication Sig Start Date End Date Taking? Authorizing Provider  aspirin 81 MG chewable tablet Chew 81 mg by mouth daily.   Yes [provider]  clopidogrel (PLAVIX) 75 MG tablet Take 75 mg by mouth daily. 10/27/18 10/27/19 Yes [provider]  ezetimibe (ZETIA) 10 MG tablet Take 10 mg by mouth daily.   Yes [provider]  glipiZIDE (GLUCOTROL) 5 MG tablet Take 1 tablet (5 mg total) by mouth daily before breakfast. 08/02/19  Yes Fritzi Mandes, MD  losartan (COZAAR) 25 MG tablet Take 25 mg by mouth daily.   Yes [provider]  metoprolol tartrate (LOPRESSOR) 50 MG tablet Take 25 mg by mouth in the morning and at bedtime.   Yes [provider]  mirtazapine (REMERON) 7.5  MG tablet Take 7.5 mg by mouth at bedtime.   Yes [provider]  pantoprazole (PROTONIX) 40 MG tablet Take 40 mg by mouth daily.   Yes [provider]    Physical Exam: Vitals:   08/03/19 0000 08/03/19 0030 08/03/19 0130 08/03/19 0200  BP: 125/63 123/73 (!) 142/75 (!) 143/70  Pulse: (!) 107 (!) 116 (!) 107 (!) 104  Resp: (!) 22 (!) 44 (!) 23 (!) 23  Temp:      TempSrc:      SpO2: 98% 91% 100% 100%  Weight:      Height:        Constitutional: NAD, calm, comfortable, thin elderly male laying in bed Vitals:   08/03/19 0000 08/03/19 0030 08/03/19 0130 08/03/19 0200    BP: 125/63 123/73 (!) 142/75 (!) 143/70  Pulse: (!) 107 (!) 116 (!) 107 (!) 104  Resp: (!) 22 (!) 44 (!) 23 (!) 23  Temp:      TempSrc:      SpO2: 98% 91% 100% 100%  Weight:      Height:       Eyes: PERRL, lids and conjunctivae normal, Sutured laceration to above right eye.  ENMT: Mucous membranes are moist.  Neck: normal, supple Respiratory: clear to auscultation bilaterally, no wheezing, no crackles. Normal respiratory effort on 2L via [place]. No accessory muscle use.  Cardiovascular: Regular rate and rhythm, no murmurs / rubs / gallops. No extremity edema. Abdomen: no tenderness, no masses palpated.  Bowel sounds positive.  Musculoskeletal: no clubbing / cyanosis. No joint deformity upper and lower extremities. Good ROM, no contractures. Normal muscle tone.  Skin: no rashes, lesions, ulcers. No induration Neurologic: CN 2-12 grossly intact. Sensation intact, Strength 5/5 in all 4.  Psychiatric: Pt alert and oriented x 4 but was a poor historian. He also seemed very fixated on buckling and unbuckling his belt repeatedly.     Labs on Admission: I have personally reviewed following labs and imaging studies  CBC: Recent Labs  Lab 08/01/19 1317 08/02/19 1702  WBC 5.1 14.4*  NEUTROABS 3.6 12.5*  HGB 12.7* 13.7  HCT 38.1* 40.3  MCV 91.1 90.4  PLT 151 151   Basic Metabolic Panel: Recent Labs  Lab 08/01/19 1317 08/02/19 1702  NA 132* 130*  K 3.5 3.9  CL 98 98  CO2 27 27  GLUCOSE 267* 119*  BUN 14 17  CREATININE 1.29* 0.95  CALCIUM 8.2* 7.3*   GFR: Estimated Creatinine Clearance: 55 mL/min (by C-G formula based on SCr of 0.95 mg/dL). Liver Function Tests: Recent Labs  Lab 08/01/19 1317 08/02/19 1702  AST 32 28  ALT 14 12  ALKPHOS 76 69  BILITOT 0.5 0.6  PROT 6.4* 5.7*  ALBUMIN 3.2* 2.8*   No results for input(s): LIPASE, AMYLASE in the last 168 hours. No results for input(s): AMMONIA in the last 168 hours. Coagulation Profile: No results for input(s): INR,  PROTIME in the last 168 hours. Cardiac Enzymes: No results for input(s): CKTOTAL, CKMB, CKMBINDEX, TROPONINI in the last 168 hours. BNP (last 3 results) No results for input(s): PROBNP in the last 8760 hours. HbA1C: No results for input(s): HGBA1C in the last 72 hours. CBG: Recent Labs  Lab 08/02/19 1624 08/02/19 1800 08/02/19 1913 08/02/19 2325 08/03/19 0252  GLUCAP 50* 174* 157* 190* 173*   Lipid Profile: No results for input(s): CHOL, HDL, LDLCALC, TRIG, CHOLHDL, LDLDIRECT in the last 72 hours. Thyroid Function Tests: No results for input(s): TSH, T4TOTAL, FREET4,  T3FREE, THYROIDAB in the last 72 hours. Anemia Panel: No results for input(s): VITAMINB12, FOLATE, FERRITIN, TIBC, IRON, RETICCTPCT in the last 72 hours. Urine analysis:    Component Value Date/Time   COLORURINE YELLOW (A) 08/02/2019 2031   APPEARANCEUR CLEAR (A) 08/02/2019 2031   LABSPEC 1.011 08/02/2019 2031   PHURINE 5.0 08/02/2019 2031   GLUCOSEU 150 (A) 08/02/2019 2031   HGBUR SMALL (A) 08/02/2019 2031   BILIRUBINUR NEGATIVE 08/02/2019 2031   KETONESUR NEGATIVE 08/02/2019 2031   PROTEINUR 30 (A) 08/02/2019 2031   NITRITE NEGATIVE 08/02/2019 2031   LEUKOCYTESUR NEGATIVE 08/02/2019 2031    Radiological Exams on Admission: DG Chest 1 View  Result Date: 08/02/2019 CLINICAL DATA:  Syncope, fall, COVID-19 positive EXAM: CHEST  1 VIEW COMPARISON:  Chest radiograph from one day prior. FINDINGS: Stable cardiomediastinal silhouette with normal heart size. No pneumothorax. No pleural effusion. Patchy reticular opacities in both lungs, most prominent at the lung bases, unchanged. No acute consolidative airspace disease. No pulmonary edema. No displaced fractures. IMPRESSION: 1. No acute cardiopulmonary disease. 2. Stable patchy reticular opacities in both lungs, most prominent at the lung bases, compatible with chronic pulmonary fibrosis. Electronically Signed   By: Delbert Phenix M.D.   On: 08/02/2019 17:28   CT Head  Wo Contrast  Result Date: 08/02/2019 CLINICAL DATA:  Head trauma, headache Syncope, fall, hit head, R eyebrow lac Fall striking head, less than 1 hour post hospital discharge. EXAM: CT HEAD WITHOUT CONTRAST TECHNIQUE: Contiguous axial images were obtained from the base of the skull through the vertex without intravenous contrast. COMPARISON:  Head CT yesterday. FINDINGS: Brain: No intracranial hemorrhage, mass effect, or midline shift. Stable degree of atrophy and chronic small vessel ischemia. No hydrocephalus. The basilar cisterns are patent. Unchanged right occipital encephalomalacia. No evidence of territorial infarct or acute ischemia. No extra-axial or intracranial fluid collection. Vascular: Atherosclerosis of skullbase vasculature without hyperdense vessel or abnormal calcification. Skull: No fracture or focal lesion. Sinuses/Orbits: Paranasal sinuses and mastoid air cells are clear. The visualized orbits are unremarkable. Minimal skin irregularity about the right supraorbital soft tissues likely site of laceration. Bilateral cataract resection. Other: None. IMPRESSION: 1. No acute intracranial abnormality. No skull fracture. 2. Unchanged atrophy and chronic small vessel ischemia. Unchanged right occipital encephalomalacia. Electronically Signed   By: Narda Rutherford M.D.   On: 08/02/2019 19:57   CT Head Wo Contrast  Result Date: 08/01/2019 CLINICAL DATA:  Head trauma, headache, speech difficulty EXAM: CT HEAD WITHOUT CONTRAST TECHNIQUE: Contiguous axial images were obtained from the base of the skull through the vertex without intravenous contrast. COMPARISON:  04/06/2016 FINDINGS: Brain: No evidence of acute infarction, hemorrhage, hydrocephalus, extra-axial collection or mass lesion/mass effect. New, although nonacute encephalomalacia of the right occipital lobe. Periventricular white matter hypodensity. Vascular: No hyperdense vessel or unexpected calcification. Skull: Normal. Negative for  fracture or focal lesion. Sinuses/Orbits: No acute finding. Other: None. IMPRESSION: 1. No acute intracranial pathology. 2. New, although nonacute encephalomalacia of the right occipital lobe when compared to prior CT dated 2017. 3. Small-vessel white matter disease. Electronically Signed   By: Lauralyn Primes M.D.   On: 08/01/2019 14:11   CT Cervical Spine Wo Contrast  Result Date: 08/02/2019 CLINICAL DATA:  Choose 1 Polytrauma, critical, head/C-spine injury suspected Mechanical fall striking head approximately 1 hour after discharge for COVID and hypoglycemia. EXAM: CT CERVICAL SPINE WITHOUT CONTRAST TECHNIQUE: Multidetector CT imaging of the cervical spine was performed without intravenous contrast. Multiplanar CT image reconstructions were also generated.  COMPARISON:  Cervical spine CT 04/06/2016 FINDINGS: Alignment: Minimal anterolisthesis of C6 on C7 is unchanged from prior exam. No traumatic subluxation. Skull base and vertebrae: No acute fracture. Vertebral body heights are maintained. The dens and skull base are intact. Soft tissues and spinal canal: No prevertebral fluid or swelling. No visible canal hematoma. Disc levels: Disc space narrowing and endplate spurring at C3-C4, C4-C5, and C5-C6. Mild scattered facet hypertrophy. Upper chest: Emphysema. Biapical pleuroparenchymal scarring. Left apical opacity likely infectious in the setting COVID infection. Other: Carotid calcifications. IMPRESSION: No acute fracture or subluxation of the cervical spine. Multilevel degenerative change. Electronically Signed   By: Narda Rutherford M.D.   On: 08/02/2019 20:05   DG Chest Portable 1 View  Result Date: 08/01/2019 CLINICAL DATA:  Trouble speaking this morning. EXAM: PORTABLE CHEST 1 VIEW COMPARISON:  04/06/2016 FINDINGS: Cardiac silhouette is normal in size. No mediastinal or hilar masses. Lungs are hyperexpanded. There are chronic changes of parenchymal fibrosis that are stable. No evidence of pneumonia or  pulmonary edema. No pleural effusion or pneumothorax. Skeletal structures are grossly intact. IMPRESSION: 1. No acute cardiopulmonary disease. 2. Chronic lung changes consistent with pulmonary fibrosis. Electronically Signed   By: Amie Portland M.D.   On: 08/01/2019 13:57   DG Hip Unilat W or Wo Pelvis 2-3 Views Right  Result Date: 08/02/2019 CLINICAL DATA:  Fall, hip pain EXAM: DG HIP (WITH OR WITHOUT PELVIS) 2-3V RIGHT COMPARISON:  None FINDINGS: The osseous structures appear diffusely demineralized which may limit detection of small or nondisplaced fractures. Femoral heads are normally located. Proximal femora are intact. Bones of the pelvis are intact and congruent. No abnormal diastatic widening of the symphysis pubis or SI joints. Multilevel degenerative changes are present in the imaged portions of the spine. Vascular calcium noted in the pelvis. Remaining soft tissues are unremarkable. IMPRESSION: 1. Diffuse osseous demineralization which may limit detection of small or nondisplaced fractures. No definite acute fracture or traumatic malalignment seen. Electronically Signed   By: Kreg Shropshire M.D.   On: 08/02/2019 23:29    EKG: Independently reviewed.   Assessment/Plan Hypoglycemia in the setting of Type 2 diabetes pt was just discharged the same yesterday continue IV D10 infusion  check CBC q2hr  Might need to consider discontinuation of glipizide   COVID-19 virus infection reportedly had a positive test 2 days ago on Friday 3/12/202 Chest x-ray does not show infiltration.   H/o TIA  ASA, plavix and zetia  Chronic Pulmonary fibrosis  on 2L nasal cannula oxygen at home  HTN Continue home medications:Cozaar, metoprolol  CKD  stage III  stable  HLD  zetia  GERD  protonix  Depression Remeron  CAD  No chest pain Continue aspirin, Plavix, Zetia, BB and losartan    DVT prophylaxis:.Lovenox Code Status: Full Family Communication: Plan discussed with patient at  bedside  disposition Plan: Home with observation Consults called:  Admission status: Observation   Marinna Blane T Jemery Stacey DO [facility]   If 7PM-7AM, please contact night-coverage [email or web address]   08/03/2019, 2:55 AM

## 2019-09-10 ENCOUNTER — Ambulatory Visit: Payer: Self-pay | Admitting: *Deleted

## 2019-09-10 ENCOUNTER — Emergency Department: Payer: No Typology Code available for payment source

## 2019-09-10 ENCOUNTER — Emergency Department
Admission: EM | Admit: 2019-09-10 | Discharge: 2019-09-10 | Disposition: A | Discharge status: Home / Self Care | Payer: No Typology Code available for payment source | Attending: Student in an Organized Health Care Education/Training Program | Admitting: Student in an Organized Health Care Education/Training Program

## 2019-09-10 ENCOUNTER — Other Ambulatory Visit: Payer: Self-pay

## 2019-09-10 DIAGNOSIS — Y929 Unspecified place or not applicable: Secondary | ICD-10-CM | POA: Insufficient documentation

## 2019-09-10 DIAGNOSIS — W19XXXA Unspecified fall, initial encounter: Secondary | ICD-10-CM | POA: Insufficient documentation

## 2019-09-10 DIAGNOSIS — I129 Hypertensive chronic kidney disease with stage 1 through stage 4 chronic kidney disease, or unspecified chronic kidney disease: Secondary | ICD-10-CM | POA: Diagnosis not present

## 2019-09-10 DIAGNOSIS — N183 Chronic kidney disease, stage 3 unspecified: Secondary | ICD-10-CM | POA: Diagnosis not present

## 2019-09-10 DIAGNOSIS — E1122 Type 2 diabetes mellitus with diabetic chronic kidney disease: Secondary | ICD-10-CM | POA: Insufficient documentation

## 2019-09-10 DIAGNOSIS — Z955 Presence of coronary angioplasty implant and graft: Secondary | ICD-10-CM | POA: Diagnosis not present

## 2019-09-10 DIAGNOSIS — I2510 Atherosclerotic heart disease of native coronary artery without angina pectoris: Secondary | ICD-10-CM | POA: Insufficient documentation

## 2019-09-10 DIAGNOSIS — Y999 Unspecified external cause status: Secondary | ICD-10-CM | POA: Insufficient documentation

## 2019-09-10 DIAGNOSIS — S0191XD Laceration without foreign body of unspecified part of head, subsequent encounter: Secondary | ICD-10-CM | POA: Insufficient documentation

## 2019-09-10 DIAGNOSIS — Z79899 Other long term (current) drug therapy: Secondary | ICD-10-CM | POA: Diagnosis not present

## 2019-09-10 DIAGNOSIS — Z7982 Long term (current) use of aspirin: Secondary | ICD-10-CM | POA: Insufficient documentation

## 2019-09-10 DIAGNOSIS — Z8616 Personal history of COVID-19: Secondary | ICD-10-CM | POA: Insufficient documentation

## 2019-09-10 DIAGNOSIS — S0990XA Unspecified injury of head, initial encounter: Secondary | ICD-10-CM | POA: Insufficient documentation

## 2019-09-10 DIAGNOSIS — Z7902 Long term (current) use of antithrombotics/antiplatelets: Secondary | ICD-10-CM | POA: Insufficient documentation

## 2019-09-10 DIAGNOSIS — H547 Unspecified visual loss: Secondary | ICD-10-CM | POA: Diagnosis not present

## 2019-09-10 DIAGNOSIS — Y939 Activity, unspecified: Secondary | ICD-10-CM | POA: Insufficient documentation

## 2019-09-10 LAB — CBC
HCT: 44.1 % (ref 39.0–52.0)
Hemoglobin: 15.1 g/dL (ref 13.0–17.0)
MCH: 30.3 pg (ref 26.0–34.0)
MCHC: 34.2 g/dL (ref 30.0–36.0)
MCV: 88.4 fL (ref 80.0–100.0)
Platelets: 226 10*3/uL (ref 150–400)
RBC: 4.99 MIL/uL (ref 4.22–5.81)
RDW: 13.8 % (ref 11.5–15.5)
WBC: 8 10*3/uL (ref 4.0–10.5)
nRBC: 0 % (ref 0.0–0.2)

## 2019-09-10 LAB — BASIC METABOLIC PANEL
Anion gap: 8 (ref 5–15)
BUN: 15 mg/dL (ref 8–23)
CO2: 30 mmol/L (ref 22–32)
Calcium: 9.4 mg/dL (ref 8.9–10.3)
Chloride: 97 mmol/L — ABNORMAL LOW (ref 98–111)
Creatinine, Ser: 1.2 mg/dL (ref 0.61–1.24)
GFR calc Af Amer: >60 mL/min (ref >60)
GFR calc non Af Amer: 56 mL/min — ABNORMAL LOW (ref >60)
Glucose, Bld: 118 mg/dL — ABNORMAL HIGH (ref 70–99)
Potassium: 4.4 mmol/L (ref 3.5–5.1)
Sodium: 135 mmol/L (ref 135–145)

## 2019-09-10 LAB — SEDIMENTATION RATE: Sed Rate: 27 mm/hr — ABNORMAL HIGH (ref 0–20)

## 2019-09-10 NOTE — ED Provider Notes (Signed)
[facility] Emergency Department Provider Note    First MD Initiated Contact with Patient 09/10/19 1734     (approximate)  I have reviewed the triage vital signs and the nursing notes.   HISTORY  Chief Complaint Fall    HPI Frank Khan is a 85 y.o. male the below listed past medical history presents to the ER for right-sided headache and some pain right around the area where his laceration was recently repaired.  Denies any fevers.  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  States he did have a fall 2 days ago.  Denies any other numbness or tingling.  No sudden onset headache.  Is not the worst headache of his life.    Past Medical History:  Diagnosis Date  . Diabetes mellitus without complication ([place])   . Hypertension   . Pulmonary fibrosis ([place])   . TIA (transient ischemic attack)    Family History  Problem Relation Age of Onset  . Hyperlipidemia Sister    Past Surgical History:  Procedure Laterality Date  . CORONARY ANGIOPLASTY WITH STENT PLACEMENT     Patient Active Problem List   Diagnosis Date Noted  . TIA (transient ischemic attack)   . Pulmonary fibrosis ([place])   . Hypertension   . Diabetes mellitus without complication ([place])   . Difficulty speaking   . Hypoglycemia   . COVID-19   . CKD (chronic kidney disease), stage III   . HLD (hyperlipidemia)   . GERD (gastroesophageal reflux disease)   . Depression   . CAD (coronary artery disease)       Prior to Admission medications   Medication Sig Start Date End Date Taking? Authorizing Provider  aspirin 81 MG chewable tablet Chew 81 mg by mouth daily.    [provider]  clopidogrel (PLAVIX) 75 MG tablet Take 75 mg by mouth daily. 10/27/18 10/27/19  [provider]  ezetimibe (ZETIA) 10 MG tablet Take 10 mg by mouth daily.    [provider]  losartan (COZAAR) 25 MG tablet Take 25 mg by mouth daily.    [provider]    metoprolol tartrate (LOPRESSOR) 50 MG tablet Take 25 mg by mouth in the morning and at bedtime.    [provider]  mirtazapine (REMERON) 7.5 MG tablet Take 7.5 mg by mouth at bedtime.    [provider]  pantoprazole (PROTONIX) 40 MG tablet Take 40 mg by mouth daily.    [provider]    Allergies Doxycycline, Penicillins, Simvastatin, Chlorhexidine, and Iodine    Social History Social History   Tobacco Use  . Smoking status: Former Research scientist (life sciences)  . Smokeless tobacco: Never Used  Substance Use Topics  . Alcohol use: No  . Drug use: No    Review of Systems Patient denies headaches, rhinorrhea, blurry vision, numbness, shortness of breath, chest pain, edema, cough, abdominal pain, nausea, vomiting, diarrhea, dysuria, fevers, rashes or hallucinations unless otherwise stated above in HPI. ____________________________________________   PHYSICAL EXAM:  VITAL SIGNS: Vitals:   09/10/19 1540  BP: 112/67  Pulse: (!) 102  Resp: 18  Temp: 97.7 F (36.5 C)  SpO2: 100%    Constitutional: Alert and oriented.  Eyes: Conjunctivae are normal.   Blind right eye, no proptosis, EOMI Head: Atraumatic.  palpable right temporal artery without overlying erythema, lac appears well healing, Nose: No congestion/rhinnorhea. Mouth/Throat: Mucous membranes are moist.   Neck: No stridor. Painless ROM.  Cardiovascular:  Normal rate, regular rhythm. Grossly normal heart sounds.  Good peripheral circulation. Respiratory: Normal respiratory effort.  No retractions. Lungs CTAB. Gastrointestinal: Soft and nontender. No distention. No abdominal bruits. No CVA tenderness. Genitourinary:  Musculoskeletal: No lower extremity tenderness nor edema.  No joint effusions. Neurologic: CN- intact.  No facial droop, Normal FNF.  Normal heel to shin.  Sensation intact bilaterally. Normal speech and language. No gross focal neurologic deficits are appreciated. No gait instability. Skin:  Skin  is warm, dry and intact. No rash noted. Psychiatric: Mood and affect are normal. Speech and behavior are normal.  ____________________________________________   LABS (all labs ordered are listed, but only abnormal results are displayed)  Results for orders placed or performed during the hospital encounter of 09/10/19 (from the past 24 hour(s))  CBC     Status: None   Collection Time: 09/10/19  3:46 PM  Result Value Ref Range   WBC 8.0 4.0 - 10.5 K/uL   RBC 4.99 4.22 - 5.81 MIL/uL   Hemoglobin 15.1 13.0 - 17.0 g/dL   HCT 44.1 39.0 - 52.0 %   MCV 88.4 80.0 - 100.0 fL   MCH 30.3 26.0 - 34.0 pg   MCHC 34.2 30.0 - 36.0 g/dL   RDW 13.8 11.5 - 15.5 %   Platelets 226 150 - 400 K/uL   nRBC 0.0 0.0 - 0.2 %  Basic metabolic panel     Status: Abnormal   Collection Time: 09/10/19  3:46 PM  Result Value Ref Range   Sodium 135 135 - 145 mmol/L   Potassium 4.4 3.5 - 5.1 mmol/L   Chloride 97 (L) 98 - 111 mmol/L   CO2 30 22 - 32 mmol/L   Glucose, Bld 118 (H) 70 - 99 mg/dL   BUN 15 8 - 23 mg/dL   Creatinine, Ser 1.20 0.61 - 1.24 mg/dL   Calcium 9.4 8.9 - 10.3 mg/dL   GFR calc non Af Amer 56 (L) >60 mL/min   GFR calc Af Amer >60 >60 mL/min   Anion gap 8 5 - 15  Sedimentation rate     Status: Abnormal   Collection Time: 09/10/19  3:46 PM  Result Value Ref Range   Sed Rate 27 (H) 0 - 20 mm/hr   ____________________________________________ __________________________________  RADIOLOGY  I personally reviewed all radiographic images ordered to evaluate for the above acute complaints and reviewed radiology reports and findings.  These findings were personally discussed with the patient.  Please see medical record for radiology report.  ____________________________________________   PROCEDURES  Procedure(s) performed:  Procedures    Critical Care performed: no ____________________________________________   INITIAL IMPRESSION / ASSESSMENT AND PLAN / ED COURSE  Pertinent labs &  imaging results that were available during my care of the patient were reviewed by me and considered in my medical decision making (see chart for details).   DDX: Tension headache, GCA, cellulitis, contusion, IPH, subdural, SAH  Frank Khan is a 85 y.o. who presents to the ED with symptoms as described above.  Patient in no acute distress.  Has a reassuring exam.  Temporal arteries palpable but not specifically tender.  Will order ESR to further risk stratify.  As he did have another fall will order repeat CT imaging.   Clinical Course as of Sep 10 2215  Thu Sep 10, 2019  2015 Patient CT imaging is reassuring.  Patient states he feels well and is requesting discharge home.  Still awaiting ESR patient states he already has  follow-up with ophthalmology as an outpatient.  Nevertheless at this point I think he is clinically appropriate for outpatient follow-up.  Have discussed with the patient and available family all diagnostics and treatments performed thus far and all questions were answered to the best of my ability. The patient demonstrates understanding and agreement with plan.    [PR]    Clinical Course User Index [PR] Merlyn Lot, MD    The patient was evaluated in Emergency Department today for the symptoms described in the history of present illness. He/she was evaluated in the context of the global COVID-19 pandemic, which necessitated consideration that the patient might be at risk for infection with the SARS-CoV-2 virus that causes COVID-19. Institutional protocols and algorithms that pertain to the evaluation of patients at risk for COVID-19 are in a state of rapid change based on information released by regulatory bodies including the CDC and federal and state organizations. These policies and algorithms were followed during the patient's care in the ED.  As part of my medical decision making, I reviewed the following data within the [facility] notes  reviewed and incorporated, Labs reviewed, notes from prior ED visits and [place] Controlled Substance Database   ____________________________________________   FINAL CLINICAL IMPRESSION(S) / ED DIAGNOSES  Final diagnoses:  Minor head injury, initial encounter      NEW MEDICATIONS STARTED DURING THIS VISIT:  Discharge Medication List as of 09/10/2019  8:12 PM       Note:  This document was prepared using Dragon voice recognition software and may include unintentional dictation errors.    Merlyn Lot, MD 09/10/19 2217

## 2019-09-10 NOTE — Discharge Instructions (Addendum)
As discussed in the emergency department, you may use Tylenol and/or Ibuprofen for headaches. These are "Over the Counter" medications and can be found at most drug stores and grocery stores. Please use the recommended dosing instructions on the bottle/box. Do not exceed the maximum dose for either medications. Please be sure to rest and drink plenty of fluids. Please be sure to call your PCP for a follow-up visit, especially if your headaches persist.  Please call your physician or return to ED if you have: 1. Worsening or change in headaches. 2. Changes in vision. 3. New-onset nausea and vomiting. 4. Numbness, tingling, weakness in your extremities,. 4. Inability to eat or drink adequate amounts of food or liquids. 5. Chest pain, shortness of breath, or difficulty breathing. 6. Neurological changes- dizziness, fainting, loss of function of your arms, legs or other parts of your body. 7. Uncontrolled hypertension. 8. Or any other emergent concerns.  Your CT imaging is reassuring but please follow-up with [facility] as we discussed.

## 2019-09-10 NOTE — Telephone Encounter (Signed)
Patient calls-no local pcp, uses the [facility] in [place]. Hit his head above the right eye, laceration was sutured at the ED. Today he calls with a swollen blood vessel near the healed injury. Reporting the vessel throbs and he has started having headaches. Denies SOB/CP. With no local pcp and unable to get an appointment within the next several days at the [place], advised UC to evaluate.   Reason for Disposition . Nursing judgment or information in reference  Answer Assessment - Initial Assessment Questions 1. REASON FOR CALL: "What is your main concern right now?"     Swollen blood vessel above the eye where he had stitches one month ago 2. ONSET: "When did the swollen blood vessel start?"     "awhile ago" 3. SEVERITY: "How bad is the swollen blood vessel?"     Does not hurt but throbs 4. FEVER: "Do you have a fever?"     no 5. OTHER SYMPTOMS: "Do you have any other new symptoms?"     headache 6. INTERVENTIONS AND RESPONSE: "What have you done so far to try to make this better? What medications have you used?"    nothing 7. PREGNANCY: "Is there any chance you are pregnant?"     na  Protocols used: NO GUIDELINE AVAILABLE-A-AH

## 2019-09-10 NOTE — ED Triage Notes (Addendum)
Pt comes via POV from home with c/o headache and possible blood clot. Pt states he fell about two weeks ago and was evaluated in the ED. Pt states he had a CT scan completed and it was negative.  Pt states he just feel throbbing pain in his head. Pt states he just wants to get checked out and make sure he doesn't have a blood clot in his head.  Pt has redness noted to right side of head from prior fall and couple of abrasions.  Pt denies any other complaints.

## 2019-10-09 ENCOUNTER — Encounter: Payer: Self-pay | Admitting: Internal Medicine

## 2019-10-09 ENCOUNTER — Other Ambulatory Visit: Payer: Self-pay

## 2019-10-09 ENCOUNTER — Ambulatory Visit (INDEPENDENT_AMBULATORY_CARE_PROVIDER_SITE_OTHER): Payer: No Typology Code available for payment source | Admitting: Internal Medicine

## 2019-10-09 VITALS — BP 112/70 | HR 92 | Temp 98.0°F | Ht 72.0 in | Wt 116.4 lb

## 2019-10-09 DIAGNOSIS — J8410 Pulmonary fibrosis, unspecified: Secondary | ICD-10-CM

## 2019-10-09 DIAGNOSIS — J439 Emphysema, unspecified: Secondary | ICD-10-CM

## 2019-10-09 DIAGNOSIS — J9611 Chronic respiratory failure with hypoxia: Secondary | ICD-10-CM

## 2019-10-09 NOTE — Progress Notes (Signed)
OV 10/09/2019  Subjective:  Patient ID: Frank Khan, male , DOB: [birth year] , age 85 y.o. , MRN: [id number] , ADDRESS: [address] [place] [address]   PCP Center, [place]   10/09/2019 -   Chief Complaint  Patient presents with  . Consult    Patient wears 2 liters during the day when needed and sleeps with it. Patient has a dry cough. Patient has been diagnosed with pulmonary fibrosis.    Transfer of care from [facility] to [facility] center here at [facility].  HPI Frank Khan 85 y.o. -accompanied by his son.  Patient is a poor historian and details again from review of the chart and talking to the son and the patient.  It appears that he was diagnosed with pulmonary fibrosis 3 years ago and has been on oxygen 2 years.  He is not on any antifibrotic's neither he or his son recollect any conversation about antifibrotic's.  A 2017 CT chest did show emphysema but he is unaware of a diagnosis of emphysema.  He only knows his pulmonary fibrosis.  Has been on 2 L oxygen since then and it is stable.  He has lost several pounds of weight in the last few years.  His waist size is gone from 34 inches to 31 inches.  His pants are loose.  He lives with his wife son lives nearby.  They both are very frail but is still able to do ADLs.  The wife does the cooking.  Patient states he has inhalers at home but he does not want to take them.  He does not want to take many medications.  His overall goal of care is to have a simple stick quality of life based supportive care but one that does not interfere significantly with his independence or his lifestyle at home as is.  He is not able to give me much history about his underlying interstitial lung disease.  He does not want to take any antifibrotic's.  He is refused home physical therapy despite his frailty and despite his son asking him.  He is okay doing some blood work and CT scan.  He does not want to do pulmonary function test and is also too  frail for that.  Imaging shows: I personally reviewed this-on Apr 06 2016 it appears she had a CT scan of the chest because of trauma reasons.  I personally visualized this.  It shows emphysematous changes but also shows some architectural changes particularly in the lower lobes with left-sided bulla and right lower lobe honeycombing.Marland Kitchen  He was Covid positive on August 01, 2019.  Chest x-ray the following day August 02, 2019 showed significant amount of chronic pulmonary fibrotic changes.     Results for Frank Khan, Frank Khan (MRN [id number]) as of 10/09/2019 11:22  Ref. Range 08/01/2019 17:53  SARS Coronavirus 2 by RT PCR Latest Ref Range: NEGATIVE  POSITIVE (A)    ROS - per HPI  Results for Frank Khan, Frank Khan (MRN [id number]) as of 10/09/2019 11:22  Ref. Range 09/10/2019 15:46  Sed Rate Latest Ref Range: 0 - 20 mm/hr 27 (H)   Results for Frank Khan, Frank Khan (MRN [id number]) as of 10/09/2019 11:22  Ref. Range 08/01/2019 13:17 08/02/2019 17:02  Eosinophils Absolute Latest Ref Range: 0.0 - 0.5 K/uL 0.1 0.0    has a past medical history of Diabetes mellitus without complication (Frank Khan), Hypertension, Pulmonary fibrosis (Frank Khan), and TIA (transient ischemic attack).  reports that he has quit smoking. He has never used smokeless tobacco.  Past Surgical History:  Procedure Laterality Date  . CORONARY ANGIOPLASTY WITH STENT PLACEMENT      Allergies  Allergen Reactions  . Doxycycline     [facility], reaction not listed   . Penicillins Hives    Has patient had a PCN reaction causing immediate rash, facial/tongue/throat swelling, SOB or lightheadedness with hypotension: yes Has patient had a PCN reaction causing severe rash involving mucus membranes or skin necrosis: no Has patient had a PCN reaction that required hospitalization no Has patient had a PCN reaction occurring within the last 10 years: no If all of the above answers are "NO", then may proceed with Cephalosporin use.    . Simvastatin     Per [facility]  records, reaction not given  . Chlorhexidine Itching  . Iodine Rash    Immunization History  Administered Date(s) Administered  . Tdap 02/19/2017    Family History  Problem Relation Age of Onset  . Hyperlipidemia Sister      Current Outpatient Medications:  .  aspirin 81 MG chewable tablet, Chew 81 mg by mouth daily., Disp: , Rfl:  .  clopidogrel (PLAVIX) 75 MG tablet, Take 75 mg by mouth daily., Disp: , Rfl:  .  ezetimibe (ZETIA) 10 MG tablet, Take 10 mg by mouth daily., Disp: , Rfl:  .  losartan (COZAAR) 25 MG tablet, Take 25 mg by mouth daily., Disp: , Rfl:  .  metoprolol tartrate (LOPRESSOR) 50 MG tablet, Take 25 mg by mouth in the morning and at bedtime., Disp: , Rfl:  .  mirtazapine (REMERON) 7.5 MG tablet, Take 7.5 mg by mouth at bedtime., Disp: , Rfl:  .  pantoprazole (PROTONIX) 40 MG tablet, Take 40 mg by mouth daily., Disp: , Rfl:       Objective:   Vitals:   10/09/19 1202  BP: 112/70  Pulse: 92  Temp: 98 F (36.7 C)  TempSrc: Temporal  SpO2: (!) 87%  Weight: 116 lb 6.4 oz (52.8 kg)  Height: 6' (1.829 m)    Estimated body mass index is 15.79 kg/m as calculated from the following:   Height as of this encounter: 6' (1.829 m).   Weight as of this encounter: 116 lb 6.4 oz (52.8 kg).  '@WEIGHTCHANGE' @  [employer]   10/09/19 1202  Weight: 116 lb 6.4 oz (52.8 kg)     Physical Exam Extremely cachectic frail elderly male sitting on a wheelchair.  Oxygen is on at 2 L.  He is alert and oriented.  Good cognition but slow to talk.  He has bilateral bibasal crackles.  No stigmata of connective tissue disease no edema.  Abdomen is soft.  He has significant diffuse wasting.         Assessment:       ICD-10-CM   1. Pulmonary emphysema with fibrosis of lung ([place])  J43.9    J84.10   2. Chronic respiratory failure with hypoxia (HCC)  J96.11        Plan:     Patient Instructions     ICD-10-CM   1. Pulmonary emphysema with fibrosis of lung ([place])  J43.9     J84.10   2. Chronic respiratory failure with hypoxia (HCC)  J96.11     You have combined emphysema and pulmonary fibrosis  Plan -Overall support a plan that focuses on quality of life and your goals of care -Based on this, -Hold off on home physical  therapy -Do not recommend Esbriet or Ofev because of side effect profile -Continue oxygen as before -Respect refusal to take schedule inhaler therapy for your emphysema -Do blood work ANA, ESR, CCP, rheumatoid factor -do this today -Do not recommend pulmonary function testing because you are too frail for it -In 2 months do high-resolution CT scan of the chest supine and prone  Follow-up -High-resolution CT chest supine and prone in 2 months and then visit Dr. Chase Caller for a 30-minute slot in [facility]    Dr. Brand Males, M.D., F.C.C.P,  Pulmonary and Critical Care Medicine Staff Physician, [place] Director - Interstitial Lung Disease  Program  Pulmonary [place] at [place], [place], [address]  Pager: [phone], If no answer or between  15:00h - 7:00h: call [phone] Telephone: [phone]  12:58 PM 10/09/2019

## 2019-10-09 NOTE — Addendum Note (Signed)
Addended by: Benjie Karvonen R on: 10/09/2019 05:12 PM   Modules accepted: Orders

## 2019-10-09 NOTE — Patient Instructions (Addendum)
ICD-10-CM   1. Pulmonary emphysema with fibrosis of lung ([place])  J43.9    J84.10   2. Chronic respiratory failure with hypoxia (HCC)  J96.11     You have combined emphysema and pulmonary fibrosis  Plan -Overall support a plan that focuses on quality of life and your goals of care -Based on this, -Hold off on home physical therapy -Do not recommend Esbriet or Ofev because of side effect profile -Continue oxygen as before -Respect refusal to take schedule inhaler therapy for your emphysema -Do blood work ANA, ESR, CCP, rheumatoid factor -do this today -Do not recommend pulmonary function testing because you are too frail for it -In 2 months do high-resolution CT scan of the chest supine and prone  Follow-up -High-resolution CT chest supine and prone in 2 months and then visit Dr. Chase Caller for a 30-minute slot in [place]

## 2019-10-12 ENCOUNTER — Telehealth: Payer: Self-pay | Admitting: Internal Medicine

## 2019-10-12 DIAGNOSIS — J439 Emphysema, unspecified: Secondary | ICD-10-CM

## 2019-10-12 NOTE — Telephone Encounter (Signed)
Plan from [place] 10/09/19:     Patient Instructions     ICD-10-CM   1. Pulmonary emphysema with fibrosis of lung ([place])  J43.9    J84.10   2. Chronic respiratory failure with hypoxia (HCC)  J96.11     You have combined emphysema and pulmonary fibrosis  Plan -Overall support a plan that focuses on quality of life and your goals of care -Based on this, -Hold off on home physical therapy -Do not recommend Esbriet or Ofev because of side effect profile -Continue oxygen as before -Respect refusal to take schedule inhaler therapy for your emphysema -Do blood work ANA, ESR, CCP, rheumatoid factor -do this today -Do not recommend pulmonary function testing because you are too frail for it -In 2 months do high-resolution CT scan of the chest supine and prone  Lab orders were not placed after pt's OV with MR Friday 5/21. I have now placed these.   called and spoke with pt's wife letting her know that [facility] will contact them in June to get pt scheduled for the CT and stated to her that the lab orders have been placed so pt can go to [place] at any time to have labwork done. She verbalized understanding.  Nothing further needed.

## 2019-11-08 ENCOUNTER — Emergency Department: Payer: No Typology Code available for payment source | Admitting: Anesthesiology

## 2019-11-08 ENCOUNTER — Observation Stay
Admission: EM | Admit: 2019-11-08 | Discharge: 2019-11-09 | Disposition: A | Discharge status: Home / Self Care | Payer: No Typology Code available for payment source | Attending: Surgery | Admitting: Surgery

## 2019-11-08 ENCOUNTER — Other Ambulatory Visit: Payer: Self-pay

## 2019-11-08 ENCOUNTER — Encounter
Admission: EM | Disposition: A | Discharge status: Home / Self Care | Payer: Self-pay | Source: Home / Self Care | Attending: Emergency Medicine

## 2019-11-08 ENCOUNTER — Emergency Department: Payer: No Typology Code available for payment source

## 2019-11-08 DIAGNOSIS — N183 Chronic kidney disease, stage 3 unspecified: Secondary | ICD-10-CM | POA: Insufficient documentation

## 2019-11-08 DIAGNOSIS — I129 Hypertensive chronic kidney disease with stage 1 through stage 4 chronic kidney disease, or unspecified chronic kidney disease: Secondary | ICD-10-CM | POA: Insufficient documentation

## 2019-11-08 DIAGNOSIS — Z79899 Other long term (current) drug therapy: Secondary | ICD-10-CM | POA: Insufficient documentation

## 2019-11-08 DIAGNOSIS — Z955 Presence of coronary angioplasty implant and graft: Secondary | ICD-10-CM | POA: Diagnosis not present

## 2019-11-08 DIAGNOSIS — R1011 Right upper quadrant pain: Secondary | ICD-10-CM

## 2019-11-08 DIAGNOSIS — Z8673 Personal history of transient ischemic attack (TIA), and cerebral infarction without residual deficits: Secondary | ICD-10-CM | POA: Diagnosis not present

## 2019-11-08 DIAGNOSIS — Z7982 Long term (current) use of aspirin: Secondary | ICD-10-CM | POA: Insufficient documentation

## 2019-11-08 DIAGNOSIS — E1151 Type 2 diabetes mellitus with diabetic peripheral angiopathy without gangrene: Secondary | ICD-10-CM | POA: Diagnosis not present

## 2019-11-08 DIAGNOSIS — J8410 Pulmonary fibrosis, unspecified: Secondary | ICD-10-CM | POA: Insufficient documentation

## 2019-11-08 DIAGNOSIS — Z87891 Personal history of nicotine dependence: Secondary | ICD-10-CM | POA: Diagnosis not present

## 2019-11-08 DIAGNOSIS — Z8616 Personal history of COVID-19: Secondary | ICD-10-CM | POA: Insufficient documentation

## 2019-11-08 DIAGNOSIS — E1122 Type 2 diabetes mellitus with diabetic chronic kidney disease: Secondary | ICD-10-CM | POA: Insufficient documentation

## 2019-11-08 DIAGNOSIS — K8000 Calculus of gallbladder with acute cholecystitis without obstruction: Secondary | ICD-10-CM | POA: Diagnosis not present

## 2019-11-08 DIAGNOSIS — E785 Hyperlipidemia, unspecified: Secondary | ICD-10-CM | POA: Insufficient documentation

## 2019-11-08 DIAGNOSIS — K219 Gastro-esophageal reflux disease without esophagitis: Secondary | ICD-10-CM | POA: Diagnosis not present

## 2019-11-08 DIAGNOSIS — F329 Major depressive disorder, single episode, unspecified: Secondary | ICD-10-CM | POA: Insufficient documentation

## 2019-11-08 DIAGNOSIS — I2510 Atherosclerotic heart disease of native coronary artery without angina pectoris: Secondary | ICD-10-CM | POA: Diagnosis not present

## 2019-11-08 DIAGNOSIS — K819 Cholecystitis, unspecified: Secondary | ICD-10-CM

## 2019-11-08 DIAGNOSIS — R1013 Epigastric pain: Secondary | ICD-10-CM

## 2019-11-08 LAB — CBC WITH DIFFERENTIAL/PLATELET
Abs Immature Granulocytes: 0.03 10*3/uL (ref 0.00–0.07)
Basophils Absolute: 0 10*3/uL (ref 0.0–0.1)
Basophils Relative: 0 %
Eosinophils Absolute: 0.1 10*3/uL (ref 0.0–0.5)
Eosinophils Relative: 1 %
HCT: 40.3 % (ref 39.0–52.0)
Hemoglobin: 13.7 g/dL (ref 13.0–17.0)
Immature Granulocytes: 0 %
Lymphocytes Relative: 6 %
Lymphs Abs: 0.6 10*3/uL — ABNORMAL LOW (ref 0.7–4.0)
MCH: 30.7 pg (ref 26.0–34.0)
MCHC: 34 g/dL (ref 30.0–36.0)
MCV: 90.4 fL (ref 80.0–100.0)
Monocytes Absolute: 0.5 10*3/uL (ref 0.1–1.0)
Monocytes Relative: 5 %
Neutro Abs: 8.2 10*3/uL — ABNORMAL HIGH (ref 1.7–7.7)
Neutrophils Relative %: 88 %
Platelets: 184 10*3/uL (ref 150–400)
RBC: 4.46 MIL/uL (ref 4.22–5.81)
RDW: 12.8 % (ref 11.5–15.5)
WBC: 9.5 10*3/uL (ref 4.0–10.5)
nRBC: 0 % (ref 0.0–0.2)

## 2019-11-08 LAB — COMPREHENSIVE METABOLIC PANEL
ALT: 13 U/L (ref 0–44)
AST: 27 U/L (ref 15–41)
Albumin: 3.6 g/dL (ref 3.5–5.0)
Alkaline Phosphatase: 77 U/L (ref 38–126)
Anion gap: 11 (ref 5–15)
BUN: 17 mg/dL (ref 8–23)
CO2: 25 mmol/L (ref 22–32)
Calcium: 8.9 mg/dL (ref 8.9–10.3)
Chloride: 93 mmol/L — ABNORMAL LOW (ref 98–111)
Creatinine, Ser: 1.05 mg/dL (ref 0.61–1.24)
GFR calc Af Amer: >60 mL/min (ref >60)
GFR calc non Af Amer: >60 mL/min (ref >60)
Glucose, Bld: 262 mg/dL — ABNORMAL HIGH (ref 70–99)
Potassium: 3.8 mmol/L (ref 3.5–5.1)
Sodium: 129 mmol/L — ABNORMAL LOW (ref 135–145)
Total Bilirubin: 0.8 mg/dL (ref 0.3–1.2)
Total Protein: 7.1 g/dL (ref 6.5–8.1)

## 2019-11-08 LAB — GLUCOSE, CAPILLARY
Glucose-Capillary: 111 mg/dL — ABNORMAL HIGH (ref 70–99)
Glucose-Capillary: 152 mg/dL — ABNORMAL HIGH (ref 70–99)

## 2019-11-08 LAB — SARS CORONAVIRUS 2 BY RT PCR (HOSPITAL ORDER, PERFORMED IN ~~LOC~~ HOSPITAL LAB): SARS Coronavirus 2: NEGATIVE

## 2019-11-08 LAB — LIPASE, BLOOD: Lipase: 29 U/L (ref 11–51)

## 2019-11-08 LAB — TROPONIN I (HIGH SENSITIVITY)
Troponin I (High Sensitivity): 6 ng/L (ref <18)
Troponin I (High Sensitivity): 8 ng/L (ref <18)

## 2019-11-08 SURGERY — CHOLECYSTECTOMY, ROBOT-ASSISTED, LAPAROSCOPIC
Anesthesia: General

## 2019-11-08 MED ORDER — SORBITOL 70 % SOLN
30.0000 mL | Freq: Every day | Status: DC | PRN
  Filled 2019-11-08: qty 30

## 2019-11-08 MED ORDER — BUPIVACAINE-EPINEPHRINE (PF) 0.25% -1:200000 IJ SOLN
INTRAMUSCULAR | Status: DC | PRN
  Administered 2019-11-08: 30 mL via PERINEURAL

## 2019-11-08 MED ORDER — BUPIVACAINE LIPOSOME 1.3 % IJ SUSP
INTRAMUSCULAR | Status: AC
  Filled 2019-11-08: qty 20

## 2019-11-08 MED ORDER — ASPIRIN 81 MG PO CHEW
81.0000 mg | CHEWABLE_TABLET | Freq: Every day | ORAL | Status: DC
  Administered 2019-11-09: 81 mg via ORAL
  Filled 2019-11-08: qty 1

## 2019-11-08 MED ORDER — KETOROLAC TROMETHAMINE 15 MG/ML IJ SOLN
15.0000 mg | Freq: Four times a day (QID) | INTRAMUSCULAR | Status: AC
  Administered 2019-11-08: 15 mg via INTRAVENOUS

## 2019-11-08 MED ORDER — FENTANYL CITRATE (PF) 100 MCG/2ML IJ SOLN: 25.0000 ug | INTRAMUSCULAR | Status: DC | PRN

## 2019-11-08 MED ORDER — SUCCINYLCHOLINE CHLORIDE 20 MG/ML IJ SOLN
INTRAMUSCULAR | Status: DC | PRN
  Administered 2019-11-08: 100 mg via INTRAVENOUS

## 2019-11-08 MED ORDER — ACETAMINOPHEN 500 MG PO TABS
1000.0000 mg | ORAL_TABLET | Freq: Four times a day (QID) | ORAL | Status: DC
  Administered 2019-11-08 – 2019-11-09 (×3): 1000 mg via ORAL
  Filled 2019-11-08 (×3): qty 2

## 2019-11-08 MED ORDER — DEXAMETHASONE SODIUM PHOSPHATE 10 MG/ML IJ SOLN
INTRAMUSCULAR | Status: AC
  Filled 2019-11-08: qty 1

## 2019-11-08 MED ORDER — SUCCINYLCHOLINE CHLORIDE 200 MG/10ML IV SOSY
PREFILLED_SYRINGE | INTRAVENOUS | Status: AC
  Filled 2019-11-08: qty 20

## 2019-11-08 MED ORDER — ONDANSETRON HCL 4 MG/2ML IJ SOLN: 4.0000 mg | Freq: Once | INTRAMUSCULAR | Status: DC | PRN

## 2019-11-08 MED ORDER — CIPROFLOXACIN IN D5W 400 MG/200ML IV SOLN
400.0000 mg | Freq: Two times a day (BID) | INTRAVENOUS | Status: AC
  Administered 2019-11-09: 400 mg via INTRAVENOUS
  Filled 2019-11-08: qty 200

## 2019-11-08 MED ORDER — PHENYLEPHRINE HCL-NACL 20-0.9 MG/250ML-% IV SOLN
INTRAVENOUS | Status: DC | PRN
  Administered 2019-11-08: 50 ug/min via INTRAVENOUS

## 2019-11-08 MED ORDER — LIDOCAINE HCL (CARDIAC) PF 100 MG/5ML IV SOSY
PREFILLED_SYRINGE | INTRAVENOUS | Status: DC | PRN
  Administered 2019-11-08: 100 mg via INTRAVENOUS

## 2019-11-08 MED ORDER — BUPIVACAINE-EPINEPHRINE (PF) 0.25% -1:200000 IJ SOLN
INTRAMUSCULAR | Status: AC
  Filled 2019-11-08: qty 30

## 2019-11-08 MED ORDER — KETOROLAC TROMETHAMINE 15 MG/ML IJ SOLN: 15.0000 mg | Freq: Four times a day (QID) | INTRAMUSCULAR | Status: DC | PRN

## 2019-11-08 MED ORDER — MORPHINE SULFATE (PF) 4 MG/ML IV SOLN
4.0000 mg | INTRAVENOUS | Status: DC | PRN
  Administered 2019-11-08: 4 mg via INTRAVENOUS
  Filled 2019-11-08: qty 1

## 2019-11-08 MED ORDER — PANTOPRAZOLE SODIUM 40 MG PO TBEC
40.0000 mg | DELAYED_RELEASE_TABLET | Freq: Every day | ORAL | Status: DC
  Administered 2019-11-09: 40 mg via ORAL
  Filled 2019-11-08: qty 1

## 2019-11-08 MED ORDER — ONDANSETRON HCL 4 MG/2ML IJ SOLN: 4.0000 mg | Freq: Four times a day (QID) | INTRAMUSCULAR | Status: DC | PRN

## 2019-11-08 MED ORDER — FAMOTIDINE IN NACL 20-0.9 MG/50ML-% IV SOLN
20.0000 mg | Freq: Once | INTRAVENOUS | Status: AC
  Administered 2019-11-08: 20 mg via INTRAVENOUS
  Filled 2019-11-08: qty 50

## 2019-11-08 MED ORDER — ONDANSETRON 4 MG PO TBDP: 4.0000 mg | ORAL_TABLET | Freq: Four times a day (QID) | ORAL | Status: DC | PRN

## 2019-11-08 MED ORDER — PHENYLEPHRINE HCL (PRESSORS) 10 MG/ML IV SOLN
INTRAVENOUS | Status: DC | PRN
  Administered 2019-11-08: 100 ug via INTRAVENOUS

## 2019-11-08 MED ORDER — PROPOFOL 10 MG/ML IV BOLUS
INTRAVENOUS | Status: DC | PRN
  Administered 2019-11-08: 50 mg via INTRAVENOUS

## 2019-11-08 MED ORDER — MORPHINE SULFATE (PF) 4 MG/ML IV SOLN
4.0000 mg | Freq: Once | INTRAVENOUS | Status: AC
  Administered 2019-11-08: 4 mg via INTRAVENOUS
  Filled 2019-11-08: qty 1

## 2019-11-08 MED ORDER — IOHEXOL 300 MG/ML  SOLN
75.0000 mL | Freq: Once | INTRAMUSCULAR | Status: AC | PRN
  Administered 2019-11-08: 75 mL via INTRAVENOUS

## 2019-11-08 MED ORDER — LIDOCAINE HCL (PF) 2 % IJ SOLN
INTRAMUSCULAR | Status: AC
  Filled 2019-11-08: qty 5

## 2019-11-08 MED ORDER — SODIUM CHLORIDE 0.9 % IV BOLUS
1000.0000 mL | Freq: Once | INTRAVENOUS | Status: AC
  Administered 2019-11-08: 1000 mL via INTRAVENOUS

## 2019-11-08 MED ORDER — ROCURONIUM BROMIDE 100 MG/10ML IV SOLN
INTRAVENOUS | Status: DC | PRN
  Administered 2019-11-08: 20 mg via INTRAVENOUS

## 2019-11-08 MED ORDER — FENTANYL CITRATE (PF) 100 MCG/2ML IJ SOLN
50.0000 ug | Freq: Once | INTRAMUSCULAR | Status: AC
  Administered 2019-11-08: 50 ug via INTRAVENOUS
  Filled 2019-11-08: qty 2

## 2019-11-08 MED ORDER — DEXAMETHASONE SODIUM PHOSPHATE 10 MG/ML IJ SOLN
INTRAMUSCULAR | Status: DC | PRN
  Administered 2019-11-08: 10 mg via INTRAVENOUS

## 2019-11-08 MED ORDER — ENOXAPARIN SODIUM 30 MG/0.3ML ~~LOC~~ SOLN
30.0000 mg | SUBCUTANEOUS | Status: DC
  Administered 2019-11-09: 30 mg via SUBCUTANEOUS
  Filled 2019-11-08: qty 0.3

## 2019-11-08 MED ORDER — ONDANSETRON HCL 4 MG/2ML IJ SOLN
4.0000 mg | Freq: Once | INTRAMUSCULAR | Status: AC
  Administered 2019-11-08: 4 mg via INTRAVENOUS
  Filled 2019-11-08: qty 2

## 2019-11-08 MED ORDER — CIPROFLOXACIN IN D5W 400 MG/200ML IV SOLN
400.0000 mg | Freq: Two times a day (BID) | INTRAVENOUS | Status: DC
  Administered 2019-11-08: 400 mg via INTRAVENOUS
  Filled 2019-11-08: qty 200

## 2019-11-08 MED ORDER — SUGAMMADEX SODIUM 500 MG/5ML IV SOLN
INTRAVENOUS | Status: DC | PRN
  Administered 2019-11-08: 200 mg via INTRAVENOUS

## 2019-11-08 MED ORDER — HYDROCODONE-ACETAMINOPHEN 5-325 MG PO TABS: 1.0000 | ORAL_TABLET | ORAL | Status: DC | PRN

## 2019-11-08 MED ORDER — ROCURONIUM BROMIDE 10 MG/ML (PF) SYRINGE
PREFILLED_SYRINGE | INTRAVENOUS | Status: AC
  Filled 2019-11-08: qty 20

## 2019-11-08 MED ORDER — ONDANSETRON HCL 4 MG/2ML IJ SOLN
INTRAMUSCULAR | Status: AC
  Filled 2019-11-08: qty 2

## 2019-11-08 MED ORDER — MORPHINE SULFATE (PF) 2 MG/ML IV SOLN: 2.0000 mg | INTRAVENOUS | Status: DC | PRN

## 2019-11-08 MED ORDER — PHENYLEPHRINE HCL (PRESSORS) 10 MG/ML IV SOLN
INTRAVENOUS | Status: AC
  Filled 2019-11-08: qty 1

## 2019-11-08 MED ORDER — ONDANSETRON HCL 4 MG/2ML IJ SOLN
INTRAMUSCULAR | Status: DC | PRN
  Administered 2019-11-08: 4 mg via INTRAVENOUS

## 2019-11-08 MED ORDER — SODIUM CHLORIDE 1 G PO TABS
1.0000 g | ORAL_TABLET | Freq: Once | ORAL | Status: AC
  Administered 2019-11-08: 1 g via ORAL
  Filled 2019-11-08 (×2): qty 1

## 2019-11-08 MED ORDER — KCL IN DEXTROSE-NACL 20-5-0.45 MEQ/L-%-% IV SOLN
INTRAVENOUS | Status: DC
  Filled 2019-11-08 (×3): qty 1000

## 2019-11-08 MED ORDER — FENTANYL CITRATE (PF) 100 MCG/2ML IJ SOLN
INTRAMUSCULAR | Status: DC | PRN
  Administered 2019-11-08 (×2): 50 ug via INTRAVENOUS

## 2019-11-08 MED ORDER — LACTATED RINGERS IV SOLN: INTRAVENOUS | Status: DC | PRN

## 2019-11-08 MED ORDER — SODIUM CHLORIDE 0.9 % IV SOLN: Freq: Once | INTRAVENOUS | Status: DC

## 2019-11-08 MED ORDER — EZETIMIBE 10 MG PO TABS
10.0000 mg | ORAL_TABLET | Freq: Every day | ORAL | Status: DC
  Administered 2019-11-09: 10 mg via ORAL
  Filled 2019-11-08: qty 1

## 2019-11-08 MED ORDER — KETOROLAC TROMETHAMINE 15 MG/ML IJ SOLN
INTRAMUSCULAR | Status: AC
  Filled 2019-11-08: qty 1

## 2019-11-08 MED ORDER — FENTANYL CITRATE (PF) 100 MCG/2ML IJ SOLN
INTRAMUSCULAR | Status: AC
  Filled 2019-11-08: qty 2

## 2019-11-08 MED ORDER — MIRTAZAPINE 15 MG PO TABS
7.5000 mg | ORAL_TABLET | Freq: Every day | ORAL | Status: DC
  Administered 2019-11-08: 7.5 mg via ORAL
  Filled 2019-11-08: qty 1

## 2019-11-08 SURGICAL SUPPLY — 42 items
BAG INFUSER PRESSURE 100CC (MISCELLANEOUS) IMPLANT
CANISTER SUCT 1200ML W/VALVE (MISCELLANEOUS) ×3 IMPLANT
CHLORAPREP W/TINT 26 (MISCELLANEOUS) ×3 IMPLANT
CLIP VESOLOCK MED LG 6/CT (CLIP) ×3 IMPLANT
COVER TIP SHEARS 8 DVNC (MISCELLANEOUS) ×1 IMPLANT
COVER TIP SHEARS 8MM DA VINCI (MISCELLANEOUS) ×2
COVER WAND RF STERILE (DRAPES) ×3 IMPLANT
DECANTER SPIKE VIAL GLASS SM (MISCELLANEOUS) ×3 IMPLANT
DEFOGGER SCOPE WARMER CLEARIFY (MISCELLANEOUS) ×3 IMPLANT
DERMABOND ADVANCED (GAUZE/BANDAGES/DRESSINGS) ×2
DERMABOND ADVANCED .7 DNX12 (GAUZE/BANDAGES/DRESSINGS) ×1 IMPLANT
DRAPE ARM DVNC X/XI (DISPOSABLE) ×4 IMPLANT
DRAPE COLUMN DVNC XI (DISPOSABLE) ×1 IMPLANT
DRAPE DA VINCI XI ARM (DISPOSABLE) ×8
DRAPE DA VINCI XI COLUMN (DISPOSABLE) ×2
GLOVE ORTHO TXT STRL SZ7.5 (GLOVE) ×6 IMPLANT
GOWN STRL REUS W/ TWL LRG LVL3 (GOWN DISPOSABLE) ×4 IMPLANT
GOWN STRL REUS W/TWL LRG LVL3 (GOWN DISPOSABLE) ×8
GRASPER SUT TROCAR 14GX15 (MISCELLANEOUS) IMPLANT
IRRIGATION STRYKERFLOW (MISCELLANEOUS) IMPLANT
IRRIGATOR STRYKERFLOW (MISCELLANEOUS)
IRRIGATOR SUCT 8 DISP DVNC XI (IRRIGATION / IRRIGATOR) IMPLANT
IRRIGATOR SUCTION 8MM XI DISP (IRRIGATION / IRRIGATOR)
IV NS IRRIG 3000ML ARTHROMATIC (IV SOLUTION) IMPLANT
KIT PINK PAD W/HEAD ARE REST (MISCELLANEOUS) ×3
KIT PINK PAD W/HEAD ARM REST (MISCELLANEOUS) ×1 IMPLANT
KIT TURNOVER KIT A (KITS) ×3 IMPLANT
LABEL OR SOLS (LABEL) ×3 IMPLANT
NEEDLE HYPO 22GX1.5 SAFETY (NEEDLE) ×3 IMPLANT
NEEDLE INSUFFLATION 14GA 120MM (NEEDLE) IMPLANT
NS IRRIG 500ML POUR BTL (IV SOLUTION) ×3 IMPLANT
PACK LAP CHOLECYSTECTOMY (MISCELLANEOUS) ×3 IMPLANT
POUCH SPECIMEN RETRIEVAL 10MM (ENDOMECHANICALS) ×3 IMPLANT
SEAL CANN UNIV 5-8 DVNC XI (MISCELLANEOUS) ×4 IMPLANT
SEAL XI 5MM-8MM UNIVERSAL (MISCELLANEOUS) ×8
SET TUBE SMOKE EVAC HIGH FLOW (TUBING) ×3 IMPLANT
SOLUTION ELECTROLUBE (MISCELLANEOUS) ×3 IMPLANT
SUT MNCRL 4-0 (SUTURE) ×2
SUT MNCRL 4-0 27XMFL (SUTURE) ×1
SUT VICRYL 0 AB UR-6 (SUTURE) ×3 IMPLANT
SUTURE MNCRL 4-0 27XMF (SUTURE) ×1 IMPLANT
TROCAR Z-THREAD FIOS 11X100 BL (TROCAR) ×3 IMPLANT

## 2019-11-08 NOTE — ED Provider Notes (Signed)
[facility] Emergency Department Provider Note  ____________________________________________  Time seen: Approximately 7:19 AM  I have reviewed the triage vital signs and the nursing notes.   HISTORY  Chief Complaint Abdominal Pain   HPI Frank Khan is a 85 y.o. male with history of pulmonary fibrosis on home O2, diabetes, chronic kidney disease, hyperlipidemia, GERD who presents for evaluation of epigastric abdominal pain.  Patient reports a few hours of mil discomfort/ pressure in the epigastric region, constant and nonradiating.  No chest pain, no shortness of breath, no dizziness, no fever, no chills, no nausea, no vomiting, no diarrhea or constipation.  Last BM was this morning.   Past Medical History:  Diagnosis Date   Diabetes mellitus without complication (HCC)    Hypertension    Pulmonary fibrosis (HCC)    TIA (transient ischemic attack)     Patient Active Problem List   Diagnosis Date Noted   TIA (transient ischemic attack)    Pulmonary fibrosis (HCC)    Hypertension    Diabetes mellitus without complication (HCC)    Difficulty speaking    Hypoglycemia    COVID-19    CKD (chronic kidney disease), stage III    HLD (hyperlipidemia)    GERD (gastroesophageal reflux disease)    Depression    CAD (coronary artery disease)     Past Surgical History:  Procedure Laterality Date   CORONARY ANGIOPLASTY WITH STENT PLACEMENT      Prior to Admission medications   Medication Sig Start Date End Date Taking? Authorizing Provider  aspirin 81 MG chewable tablet Chew 81 mg by mouth daily.    [provider]  ezetimibe (ZETIA) 10 MG tablet Take 10 mg by mouth daily.    [provider]  losartan (COZAAR) 25 MG tablet Take 25 mg by mouth daily.    [provider]  metoprolol tartrate (LOPRESSOR) 50 MG tablet Take 25 mg by mouth in the morning and at bedtime.    [provider]  mirtazapine  (REMERON) 7.5 MG tablet Take 7.5 mg by mouth at bedtime.    [provider]  pantoprazole (PROTONIX) 40 MG tablet Take 40 mg by mouth daily.    [provider]    Allergies Doxycycline, Penicillins, Simvastatin, Chlorhexidine, and Iodine  Family History  Problem Relation Age of Onset   Hyperlipidemia Sister     Social History Social History   Tobacco Use   Smoking status: Former Smoker   Smokeless tobacco: Never Used  Building services engineer Use: Never used  Substance Use Topics   Alcohol use: No   Drug use: No    Review of Systems  Constitutional: Negative for fever. Eyes: Negative for visual changes. ENT: Negative for sore throat. Neck: No neck pain  Cardiovascular: Negative for chest pain. Respiratory: Negative for shortness of breath. Gastrointestinal: + epigastric abdominal pain. No vomiting or diarrhea. Genitourinary: Negative for dysuria. Musculoskeletal: Negative for back pain. Skin: Negative for rash. Neurological: Negative for headaches, weakness or numbness. Psych: No SI or HI  ____________________________________________   PHYSICAL EXAM:  VITAL SIGNS: ED Triage Vitals  Enc Vitals Group     BP 11/08/19 0608 137/65     Pulse Rate 11/08/19 0608 97     Resp 11/08/19 0608 (!) 33     Temp --      Temp src --      SpO2 11/08/19 0608 97 %     Weight 11/08/19 0610 116 lb (52.6  kg)     Height 11/08/19 0610 6' (1.829 m)     Head Circumference --      Peak Flow --      Pain Score 11/08/19 0610 3     Pain Loc --      Pain Edu? --      Excl. in GC? --     Constitutional: Alert and oriented. Well appearing and in no apparent distress. HEENT:      Head: Normocephalic and atraumatic.         Eyes: Conjunctivae are normal. Sclera is non-icteric.       Mouth/Throat: Mucous membranes are moist.       Neck: Supple with no signs of meningismus. Cardiovascular: Regular rate and rhythm. No murmurs, gallops, or rubs. 2+ symmetrical distal  pulses are present in all extremities. No JVD. Respiratory: Normal respiratory effort.  Bilateral crackles satting well on his home O2 Gastrointestinal: Soft, mild tenderness to palpation epigastric region, no right lower quadrant or right upper quadrant tenderness, negative Murphy's and non distended with positive bowel sounds. No rebound or guarding. Genitourinary: No CVA tenderness. Musculoskeletal: No edema, cyanosis, or erythema of extremities. Neurologic: Normal speech and language. Face is symmetric. Moving all extremities. No gross focal neurologic deficits are appreciated. Skin: Skin is warm, dry and intact. No rash noted. Psychiatric: Mood and affect are normal. Speech and behavior are normal.  ____________________________________________   LABS (all labs ordered are listed, but only abnormal results are displayed)  Labs Reviewed  CBC WITH DIFFERENTIAL/PLATELET - Abnormal; Notable for the following components:      Result Value   Neutro Abs 8.2 (*)    Lymphs Abs 0.6 (*)    All other components within normal limits  COMPREHENSIVE METABOLIC PANEL - Abnormal; Notable for the following components:   Sodium 129 (*)    Chloride 93 (*)    Glucose, Bld 262 (*)    All other components within normal limits  LIPASE, BLOOD  TROPONIN I (HIGH SENSITIVITY)   ____________________________________________  EKG  ED ECG REPORT I, Nita Sickle, the attending physician, personally viewed and interpreted this ECG.  Normal sinus rhythm, RBBB, LAFB, borderline prolonged QTC, borderline right axis deviation, no ST elevations.  Unchanged from prior. ____________________________________________  RADIOLOGY  I have personally reviewed the images performed during this visit and I agree with the Radiologist's read.   Interpretation by Radiologist:  No results found.    ____________________________________________   PROCEDURES  Procedure(s) performed:yes .1-3 Lead EKG  Interpretation Performed by: Nita Sickle, MD Authorized by: Nita Sickle, MD     Interpretation: non-specific     ECG rate assessment: normal     Rhythm: sinus rhythm     Conduction: abnormal (RBBB)     Critical Care performed:  None ____________________________________________   INITIAL IMPRESSION / ASSESSMENT AND PLAN / ED COURSE  85 y.o. male with history of pulmonary fibrosis on home O2, diabetes, chronic kidney disease, hyperlipidemia, GERD who presents for evaluation of a few hours of mild epigastric discomfort with no associated symptoms.    Patient is in no obvious distress with normal vitals, abdomen has mild tenderness palpation epigastric region with no rebound or guarding, negative Murphy sign, no right lower quadrant tenderness.  EKG showing no acute ischemic changes.  Patient placed on telemetry for close monitoring.  Old medical records reviewed with no signs of prior colonoscopy or imaging of his abdomen.  Differential diagnosis including pancreatitis versus GERD versus peptic ulcer disease versus gastritis  versus constipation versus gallbladder pathology versus volvulus versus SBO versus malignancy.  Plan for CBC, CMP, lipase, troponin x2, urinalysis, CT abdomen pelvis.  _________________________ 7:25 AM on 11/08/2019 -----------------------------------------  Labs showing mild hyponatremia and hypochloremia.  Will give a bolus of normal saline and 1 sodium tabs.  No white count, no anemia, mild hyperglycemia no evidence of DKA, normal kidney function.  Lipase and LFTs are within normal limits.  First troponin is negative.  CT is pending.  Care transferred to Dr. Charna Archer.      _____________________________________________ Please note:  Patient was evaluated in Emergency Department today for the symptoms described in the history of present illness. Patient was evaluated in the context of the global COVID-19 pandemic, which necessitated consideration that  the patient might be at risk for infection with the SARS-CoV-2 virus that causes COVID-19. Institutional protocols and algorithms that pertain to the evaluation of patients at risk for COVID-19 are in a state of rapid change based on information released by regulatory bodies including the CDC and federal and state organizations. These policies and algorithms were followed during the patient's care in the ED.  Some ED evaluations and interventions may be delayed as a result of limited staffing during the pandemic.   [place] Controlled Substance Database was reviewed by me. ____________________________________________   FINAL CLINICAL IMPRESSION(S) / ED DIAGNOSES   Final diagnoses:  Epigastric abdominal pain      NEW MEDICATIONS STARTED DURING THIS VISIT:  ED Discharge Orders    None       Note:  This document was prepared using Dragon voice recognition software and may include unintentional dictation errors.    Alfred Levins, [place], MD 11/08/19 [phone]

## 2019-11-08 NOTE — Transfer of Care (Signed)
Immediate Anesthesia Transfer of Care Note  Patient: Frank Khan  Procedure(s) Performed: XI ROBOTIC ASSISTED LAPAROSCOPIC CHOLECYSTECTOMY (N/A )  Patient Location: PACU  Anesthesia Type:General  Level of Consciousness: sedated  Airway & Oxygen Therapy: Patient Spontanous Breathing and Patient connected to face mask oxygen  Post-op Assessment: Report given to RN and Post -op Vital signs reviewed and stable  Post vital signs: Reviewed and stable  Last Vitals:  Vitals Value Taken Time  BP 107/86 11/08/19 2147  Temp 36.1 C 11/08/19 2147  Pulse 108 11/08/19 2147  Resp 19 11/08/19 2157  SpO2 97 % 11/08/19 2147  Vitals shown include unvalidated device data.  Last Pain:  Vitals:   11/08/19 1726  TempSrc:   PainSc: Asleep         Complications: No complications documented.

## 2019-11-08 NOTE — ED Notes (Signed)
Assisted pt to bedside commode, pt able to stand with no difficulty.

## 2019-11-08 NOTE — ED Notes (Addendum)
Pt placed in gown and yellow socks. Belongings placed in bag at bedside. [facility] card placed in biobag and placed inside of belongings bag.

## 2019-11-08 NOTE — H&P (Signed)
Patient ID: Frank Khan, male   DOB: 11/27/1934, 85 y.o.   MRN: [id number]  Chief Complaint: Epigastric pain  History of Present Illness Frank Khan is a 85 y.o. male with acute onset of epigastric pain following breakfast this morning.  He denies nausea and vomiting.  Denies prior history of postprandial epigastric pain or right upper quadrant pain.  No known fevers or chills.  He reports the pain is somewhat waxing and waning.  Currently intolerable and moaning in pain.  No prior known cholelithiasis or cholecystitis.  Prior history of coronary artery disease with a history of stents, only taking aspirin.  Took multiple aspirin this morning in an attempt to relieve the pain.  Past Medical History Past Medical History:  Diagnosis Date  . Diabetes mellitus without complication (HCC)   . Hypertension   . Pulmonary fibrosis (HCC)   . TIA (transient ischemic attack)       Past Surgical History:  Procedure Laterality Date  . CORONARY ANGIOPLASTY WITH STENT PLACEMENT      Allergies  Allergen Reactions  . Doxycycline     [facility], [facility] not [facility]   . Penicillins Hives    Has patient had a PCN reaction causing immediate rash, facial/tongue/throat swelling, SOB or lightheadedness with hypotension: yes Has patient had a PCN reaction causing severe rash involving mucus membranes or skin necrosis: no Has patient had a PCN reaction that required hospitalization no Has patient had a PCN reaction occurring within the last 10 years: no If all of the above answers are "NO", then may proceed with Cephalosporin use.    . Simvastatin     [facility] records, reaction not given  . Chlorhexidine Itching  . Iodine Rash    No current facility-administered medications for this encounter.   Current Outpatient Medications  Medication Sig Dispense Refill  . aspirin 81 MG chewable tablet Chew 81 mg by mouth daily.    . cetirizine (ZYRTEC) 10 MG tablet Take 10 mg by mouth daily.    Marland Kitchen ezetimibe  (ZETIA) 10 MG tablet Take 10 mg by mouth daily.    . mirtazapine (REMERON) 7.5 MG tablet Take 7.5 mg by mouth at bedtime.    . pantoprazole (PROTONIX) 40 MG tablet Take 40 mg by mouth daily.      Family History Family History  Problem Relation Age of Onset  . Hyperlipidemia Sister       Social History Social History   Tobacco Use  . Smoking status: Former Games developer  . Smokeless tobacco: Never Used  Vaping Use  . Vaping Use: Never used  Substance Use Topics  . Alcohol use: No  . Drug use: No        Review of Systems  Constitutional: Negative.   HENT: Negative.   Eyes: Negative.   Respiratory: Negative.   Cardiovascular: Negative.   Gastrointestinal: Negative for nausea and vomiting.  Genitourinary: Negative.   Musculoskeletal: Negative.   Skin: Negative.   Endo/Heme/Allergies: Negative.       Physical Exam Blood pressure (!) 142/76, pulse 91, temperature (!) 96.2 F (35.7 C), temperature source Axillary, resp. rate 18, height 6' (1.829 m), weight 52.6 kg, SpO2 100 %. Last Weight  Most recent update: 11/08/2019  6:12 AM   Weight  52.6 kg (116 lb)            CONSTITUTIONAL: Well developed, and thin gaunt appearing male, appropriately responsive and aware without distress.  Moaning with obvious discomfort. EYES: Sclera non-icteric.  EARS, NOSE, MOUTH AND THROAT: Mask worn.   Hearing is intact to voice.  NECK: Trachea is midline, and there is no jugular venous distension.  LYMPH NODES:  Lymph nodes in the neck are not enlarged. RESPIRATORY:  Lungs are clear, and breath sounds are equal bilaterally. Normal respiratory effort without pathologic use of accessory muscles. CARDIOVASCULAR: Heart is regular in rate and rhythm. GI: The abdomen is tender in the epigastrium with palpable mass in the right upper quadrant, otherwise soft, nontender, and nondistended.  I did not appreciate hepatosplenomegaly. There were hypoactive bowel sounds. MUSCULOSKELETAL:  Symmetrical  muscle tone appreciated in all four extremities.    SKIN: Skin turgor is normal. No pathologic skin lesions appreciated.  NEUROLOGIC: No focal changes appreciated. PSYCH:  Alert and apparently oriented to person, place and time. Affect is appropriate for situation, potentially distraught at what may be a life-threatening condition.  Seems responsive to reassurances, and aware and cognizant regarding risks.  Data Reviewed I have personally reviewed what is currently available of the patient's imaging, recent labs and medical records.   Labs:  CBC Latest Ref Rng & Units 11/08/2019 09/10/2019 08/03/2019  WBC 4.0 - 10.5 K/uL 9.5 8.0 10.3  Hemoglobin 13.0 - 17.0 g/dL 60.4 54.0 98.1  Hematocrit 39 - 52 % 40.3 44.1 42.3  Platelets 150 - 400 K/uL 184 226 151   CMP Latest Ref Rng & Units 11/08/2019 09/10/2019 08/03/2019  Glucose 70 - 99 mg/dL 191(Y) 782(N) 562(Z)  BUN 8 - 23 mg/dL 17 15 15   Creatinine 0.61 - 1.24 mg/dL 3.08 6.57  Sodium 135 - 145 mmol/L 129(L) 135 131(L)  Potassium 3.5 - 5.1 mmol/L 3.8 4.4 4.1  Chloride 98 - 111 mmol/L 93(L) 97(L) 93(L)  CO2 22 - 32 mmol/L 25 30 28   Calcium 8.9 - 10.3 mg/dL 8.9 9.4 8.46)  Total Protein 6.5 - 8.1 g/dL 7.1 - -  Total Bilirubin 0.3 - 1.2 mg/dL 0.8 - -  Alkaline Phos 38 - 126 U/L 77 - -  AST 15 - 41 U/L 27 - -  ALT 0 - 44 U/L 13 - -      Imaging: Radiology review: The images below reviewed and I concur with the report noted. Within last 24 hrs: CT ABDOMEN PELVIS W CONTRAST  Result Date: 11/08/2019 CLINICAL DATA:  Abdominal pain beginning this morning mid epigastric region. EXAM: CT ABDOMEN AND PELVIS WITH CONTRAST TECHNIQUE: Multidetector CT imaging of the abdomen and pelvis was performed using the standard protocol following bolus administration of intravenous contrast. CONTRAST:  97mL OMNIPAQUE IOHEXOL 300 MG/ML  SOLN COMPARISON:  Chest CT 04/06/2016 FINDINGS: Lower chest: Stable changes of pulmonary fibrosis within the lung bases with  honeycombing and traction bronchiectasis. Emphysematous disease. Calcification over the right coronary artery. Calcification over the descending thoracic aorta. Hepatobiliary: Cholelithiasis. Possible tiny amount of pericholecystic fluid. Liver and biliary tree are normal. Pancreas: Normal. Spleen: Normal. Adrenals/Urinary Tract: Right adrenal gland is normal. Minimal prominence of the left adrenal gland. Kidneys demonstrate no evidence of hydronephrosis. Normal size right kidney and slight decreased size of left kidney. Mild-to-moderate left renal cortical thinning. Possible punctate nonobstructing stone over the lower pole left kidney. Visualized ureters and bladder are unremarkable. Stomach/Bowel: Stomach and small bowel are within normal. Appendix is not visualized. Mild diverticulosis of the colon. Vascular/Lymphatic: Mild-to-moderate calcified plaque over the abdominal aorta which is normal in caliber. Significant calcified plaque over the origin and proximal right renal artery. Significant calcified plaque over the origin of the  left renal artery likely with significant focal stenosis at the origin. No adenopathy. Reproductive: Minimal prominence of the prostate gland. Other: No significant focal inflammatory change. Musculoskeletal: Degenerative changes of the spine and hips. Moderate disc disease at all levels of the lumbar spine. IMPRESSION: 1.  No acute findings in the abdomen/pelvis. 2. Cholelithiasis. Possible tiny amount of pericholecystic fluid. Right upper quadrant ultrasound may be helpful for further evaluation. 3. Possible punctate nonobstructing left renal stone. Left kidney slightly smaller than right with mild to moderate cortical thinning as findings likely due to the suggested left renal artery stenosis. Significant calcified plaque over the origin/proximal right renal artery. 4.  Mild colonic diverticulosis. 5.  Stable changes of pulmonary fibrosis in the lung bases. 6. Aortic  Atherosclerosis (ICD10-I70.0) and Emphysema (ICD10-J43.9). Atherosclerotic coronary artery disease. Electronically Signed   By: Marin Olp M.D.   On: 11/08/2019 08:46   US Abdomen Limited RUQ  Result Date: 11/08/2019 CLINICAL DATA:  Right upper quadrant pain. EXAM: ULTRASOUND ABDOMEN LIMITED RIGHT UPPER QUADRANT COMPARISON:  CT scan November 08, 2019 FINDINGS: Gallbladder: A 2 cm stone is seen in the gallbladder. Mild gallbladder wall thickening measuring 3.8 mm is identified. There is also a small amount of pericholecystic fluid. A positive Murphy's sign is reported. Common bile duct: Diameter: 3.9 mm Liver: No focal lesion identified. Within normal limits in parenchymal echogenicity. Portal vein is patent on color Doppler imaging with normal direction of blood flow towards the liver. Other: None. IMPRESSION: 1. There is a dominant 2 cm stone in the gallbladder with mild wall thickening, pericholecystic fluid, and a positive Murphy's sign. Findings are concerning for acute cholecystitis in the appropriate clinical setting. If the clinical picture remains ambiguous, a HIDA scan could further evaluate. 2. No other abnormalities. Electronically Signed   By: Dorise Bullion III M.D   On: 11/08/2019 10:58    Assessment    Acute calculus cholecystitis. Patient Active Problem List   Diagnosis Date Noted  . TIA (transient ischemic attack)   . Pulmonary fibrosis ([place])   . Hypertension   . Diabetes mellitus without complication ([place])   . Difficulty speaking   . Hypoglycemia   . COVID-19   . CKD (chronic kidney disease), stage III   . HLD (hyperlipidemia)   . GERD (gastroesophageal reflux disease)   . Depression   . CAD (coronary artery disease)     Plan    Robotic assisted laparoscopic cholecystectomy.  The risks, benefits, potential complications, alternative treatment options, evaluations and diagnoses, and likely outcomes were discussed in detail with the patient. The possibility of anesthetic  complications, bleeding, infection, finding a normal appearing gallbladder, trauma to adjacent organs, or injury to surrounding structures, bile leak or obstruction,  the need for additional procedures, reaction to medication, pulmonary aspiration, the possible need to convert to an open procedure, and issues requiring transfusion or further operations were discussed with the patient. Questions sought, and answered to satisfaction.  No guarantees were ever spoken or implied.  The patient and/or family concurred with the proposed plan, and gave informed consent.   Face-to-face time spent with the patient and accompanying care providers(if present) was 30 minutes, with more than 50% of the time spent counseling, educating, and coordinating care of the patient.      Ronny Bacon M.D., FACS 11/08/2019, 1:21 PM

## 2019-11-08 NOTE — ED Triage Notes (Signed)
Pt arrives to ED from home via [facility] with c/c of abdominal pain beginning at approx 3 am. Pt indicates pain located mid epigastric area just below the sternum. EMS reports transport vitals of 134/65, p83, O2 sat 94% on room air, temp 97.3 oral. Upon arrival,. Pt A&Ox4, NAD. Pt states last bowel movement last night, urine this morning, last meal was approx 1 hour before arrival. Denies N/V.

## 2019-11-08 NOTE — ED Notes (Signed)
Dr Jessup at bedside 

## 2019-11-08 NOTE — ED Notes (Signed)
cbg 111 

## 2019-11-08 NOTE — ED Notes (Signed)
Assisted pt to bedside commode pt able to stand with no assistance

## 2019-11-08 NOTE — Op Note (Signed)
Robotic cholecystectomy  Pre-operative Diagnosis: Acute calculus cholecystitis  Post-operative Diagnosis:  Same.  Procedure: Robotic assisted laparoscopic cholecystectomy.  Surgeon: Ronny Bacon, M.D., FACS  Anesthesia: General. with endotracheal tube  Findings: As expected acutely inflamed, indurated and edematous gallbladder.  Very well-defined cystic duct, long and of small caliber.  Estimated Blood Loss: 10 mL         Drains: None         Specimens: Gallbladder           Complications: none  Procedure Details  The patient was seen again in the Holding Room.  1.25 mg dose of ICG was administered intravenously.  The benefits, complications, treatment options, risks and expected outcomes were discussed with the patient. The likelihood of improving the patient's symptoms with return to their baseline status is good.  The patient and/or family concurred with the proposed plan, giving informed consent, again alternatives reviewed.  The patient was taken to Operating Room, identified, and the procedure verified as robotic assisted laparoscopic cholecystectomy.  Prior to the induction of general anesthesia, antibiotic prophylaxis was administered. VTE prophylaxis was in place. General endotracheal anesthesia was then administered and tolerated well. The patient was positioned in the supine position.  After the induction, the abdomen was prepped with Chloraprep and draped in the sterile fashion.  A Time Out was held and the above information confirmed.  After local infiltration of quarter percent Marcaine with epinephrine, stab incision was made left upper quadrant.  Just below the costal margin approximately midclavicular line the Veres needle is passed with sensation of the layers to penetrate the abdominal wall and into the peritoneum.  Saline drop test is confirmed peritoneal placement.  Insufflation is initiated with carbon dioxide to pressures of 15 mmHg.  Right infra-umbilical  local infiltration with quarter percent Marcaine with epinephrine is utilized.  Made a 12 mm incision on the left periumbilical site, I advanced an optical 35mm port under direct visualization into the peritoneal cavity.  Once the peritoneum was penetrated, insufflation was transferred.  The trocar was then advanced into the abdominal cavity under direct visualization. Pneumoperitoneum was then continued with CO2 at 14 mmHg or less and tolerated well without any adverse changes in the patient's vital signs.  Two 8.5-mm ports were placed in the left lower quadrant and laterally, and one to the right lower quadrant, all under direct vision. All skin incisions  were infiltrated with a local anesthetic agent before making the incision and placing the trocars.  I then swapped out the 11 mm optical trocar for the 12 mm robotic trocar under direct visualization. The patient was positioned  in reverse Trendelenburg, tilted the patient's left side down.  Da Vinci XI robot was then positioned on to the patient's left side, and docked.  The gallbladder was identified, the fundus grasped via the arm 4 Prograsp and retracted cephalad. Adhesions were lysed with scissors and cautery. The infundibulum was identified grasped and retracted laterally, exposing the peritoneum overlying the triangle of Calot. This was then opened and dissected using cautery & scissors. An extended critical view of the cystic duct was obtained, and never clearly identified or isolated the cystic artery, it may have been closely adherent to the cystic duct, or of such small caliber it was easily cauterized with the scissors.    The cystic duct was clearly identified and dissected to isolation.   The cystic duct was triple clipped and divided with scissors, leaving two on the remaining stump.   The  gallbladder was taken from the gallbladder fossa in a retrograde fashion with the electrocautery. The gallbladder was removed and placed in an Endocatch  bag.  Hemostasis was confirmed with the electrocautery.  The robot was undocked and moved away from the operative field. Minimal bile spillage was aspirated.  The gallbladder and Endocatch sac were then removed through the infraumbilical port site.   Inspection of the right upper quadrant was performed. No bleeding, bile duct injury or leak, or bowel injury was noted. The infra-umbilical port site fascia was closed with interrumpted 0 Vicryl sutures under direct visualization. Pneumoperitoneum having been released and ports removed.  4-0 subcuticular Monocryl was used to close the skin. Dermabond was  applied.  The patient was then extubated and brought to the recovery room in stable condition. Sponge, lap, and needle counts were correct at closure and at the conclusion of the case.               Campbell Lerner, M.D., [facility] 11/08/2019 9:35 PM

## 2019-11-08 NOTE — ED Notes (Addendum)
Vale Haven, pt's son [phone] Updated on pt's status pt talked to his son.

## 2019-11-08 NOTE — Anesthesia Procedure Notes (Signed)
Procedure Name: Intubation Date/Time: 11/08/2019 8:20 PM Performed by: Almeta Monas, CRNA Pre-anesthesia Checklist: Patient identified, Patient being monitored, Timeout performed, Emergency Drugs available and Suction available Patient Re-evaluated:Patient Re-evaluated prior to induction Oxygen Delivery Method: Circle system utilized Preoxygenation: Pre-oxygenation with 100% oxygen Induction Type: IV induction and Rapid sequence Laryngoscope Size: 3 and McGraph Grade View: Grade I Tube type: Oral Tube size: 7.0 mm Number of attempts: 1 Airway Equipment and Method: Stylet Placement Confirmation: ETT inserted through vocal cords under direct vision,  positive ETCO2 and breath sounds checked- equal and bilateral Secured at: 21 cm Tube secured with: Tape Dental Injury: Teeth and Oropharynx as per pre-operative assessment  Difficulty Due To: Difficulty was anticipated, Difficult Airway- due to anterior larynx, Difficult Airway- due to reduced neck mobility and Difficult Airway- due to dentition

## 2019-11-08 NOTE — ED Notes (Signed)
Pt assisted with phone to speak with son

## 2019-11-08 NOTE — ED Notes (Signed)
Pt assisted to bedside commode

## 2019-11-08 NOTE — ED Provider Notes (Signed)
----------------------------------------- °  7:03 AM on 11/08/2019 -----------------------------------------  Blood pressure 137/65, pulse 97, resp. rate (!) 33, height 6' (1.829 m), weight 52.6 kg, SpO2 97 %.  Assuming care from Dr. Don Perking.  In short, Frank Khan is a 85 y.o. male with a chief complaint of Abdominal Pain .  Refer to the original H&P for additional details.  The current plan of care is to follow-up CT results and repeat troponin as well as repeat BMP for hyponatremia.  ----------------------------------------- 11:55 AM on 11/08/2019 -----------------------------------------  CT scan was equivocal for cholecystitis, however follow-up ultrasound shows 2 cm stone with borderline wall thickening, pericholecystic fluid, and positive Murphy sign.  Patient continues to complain of epigastric and right upper quadrant discomfort, was treated with IV morphine.  Case discussed with Dr. Claudine Mouton of general surgery, who will plan for admission.    Chesley Noon, MD 11/08/19 1155

## 2019-11-08 NOTE — Anesthesia Preprocedure Evaluation (Addendum)
Anesthesia Evaluation  Patient identified by MRN, date of birth, ID band Patient awake    Reviewed: Allergy & Precautions, NPO status , Patient's Chart, lab work & pertinent test results  Airway Mallampati: II       Dental   Pulmonary former smoker,    Pulmonary exam normal        Cardiovascular hypertension, + CAD and + Peripheral Vascular Disease  Normal cardiovascular exam     Neuro/Psych PSYCHIATRIC DISORDERS Depression TIA   GI/Hepatic Neg liver ROS, GERD  ,  Endo/Other  diabetes  Renal/GU Renal InsufficiencyRenal disease  negative genitourinary   Musculoskeletal   Abdominal Normal abdominal exam  (+)   Peds negative pediatric ROS (+)  Hematology   Anesthesia Other Findings Past Medical History: No date: Diabetes mellitus without complication (HCC) No date: Hypertension No date: Pulmonary fibrosis (HCC) No date: TIA (transient ischemic attack)  Reproductive/Obstetrics                            Anesthesia Physical Anesthesia Plan  ASA: III and emergent  Anesthesia Plan: General   Post-op Pain Management:    Induction: Intravenous, Rapid sequence and Cricoid pressure planned  PONV Risk Score and Plan:   Airway Management Planned: Oral ETT  Additional Equipment:   Intra-op Plan:   Post-operative Plan: Extubation in OR  Informed Consent: I have reviewed the patients History and Physical, chart, labs and discussed the procedure including the risks, benefits and alternatives for the proposed anesthesia with the patient or authorized representative who has indicated his/her understanding and acceptance.     Dental advisory given  Plan Discussed with: CRNA and Surgeon  Anesthesia Plan Comments:         Anesthesia Quick Evaluation

## 2019-11-09 MED ORDER — CIPROFLOXACIN HCL 500 MG PO TABS
500.0000 mg | ORAL_TABLET | Freq: Two times a day (BID) | ORAL | 0 refills | Status: AC
Start: 2019-11-09 — End: 2019-11-16

## 2019-11-09 MED ORDER — HYDROCODONE-ACETAMINOPHEN 5-325 MG PO TABS: 1.0000 | ORAL_TABLET | Freq: Four times a day (QID) | ORAL | 0 refills | Status: DC | PRN

## 2019-11-09 MED ORDER — IBUPROFEN 800 MG PO TABS: 800.0000 mg | ORAL_TABLET | Freq: Three times a day (TID) | ORAL | 0 refills | Status: DC | PRN

## 2019-11-09 NOTE — Discharge Summary (Signed)
[facility] SURGICAL DISCHARGE SUMMARY  Patient ID: Frank Khan MRN: [id number] DOB/AGE: 07-19-1934 85 y.o.  Admit date: 11/08/2019 Discharge date: 11/09/2019  Discharge Diagnoses Patient Active Problem List   Diagnosis Date Noted   Acute cholecystitis due to biliary calculus 11/08/2019    Consultants None  Procedures 11/08/2018:  Robotic assisted laparoscopic cholecystectomy  HPI: Frank Khan is a 85 y.o. male with acute onset of epigastric pain following breakfast this morning. He denies nausea and vomiting. Denies prior history of postprandial epigastric pain or right upper quadrant pain. No known fevers or chills. He reports the pain is somewhat waxing and waning. Currently intolerable and moaning in pain. No prior known cholelithiasis or cholecystitis. Prior history of coronary artery disease with a history of stents, only taking aspirin. Took multiple aspirin this morning in an attempt to relieve the pain.  Hospital Course: Informed consent was obtained and documented, and patient underwent uneventful robotic assisted laparoscopic choelcystectomy (Dr Claudine Mouton, MD, 11/08/2019).  Post-operatively, patient's pain/symptoms improved/resolved and advancement of patient's diet and ambulation were well-tolerated. The remainder of patient's hospital course was essentially unremarkable, and discharge planning was initiated accordingly with patient safely able to be discharged home with appropriate discharge instructions, antibiotics (Cipro x7 days), pain control, and outpatient follow-up after all of his questions were answered to his expressed satisfaction.   Discharge Condition: Good    Physical Examination:  Constitutional: Well appearing male, NAD Pulmonary: Normal effort, no respiratory distress Gastrointestinal: Soft, incisional soreness, non-distended, no rebound/guarding Skin: Laparoscopic incisions are CDI with dermabond, no erythema or drainage     Allergies as of 11/09/2019      Reactions   Doxycycline    [facility], reaction not listed    Penicillins Hives   Has patient had a PCN reaction causing immediate rash, facial/tongue/throat swelling, SOB or lightheadedness with hypotension: yes Has patient had a PCN reaction causing severe rash involving mucus membranes or skin necrosis: no Has patient had a PCN reaction that required hospitalization no Has patient had a PCN reaction occurring within the last 10 years: no If all of the above answers are "NO", then may proceed with Cephalosporin use.   Simvastatin    Per [facility] records, reaction not given   Chlorhexidine Itching   Iodine Rash      Medication List    TAKE these medications   aspirin 81 MG chewable tablet Chew 81 mg by mouth daily.   cetirizine 10 MG tablet Commonly known as: ZYRTEC Take 10 mg by mouth daily.   ciprofloxacin 500 MG tablet Commonly known as: Cipro Take 1 tablet (500 mg total) by mouth 2 (two) times daily for 7 days.   ezetimibe 10 MG tablet Commonly known as: ZETIA Take 10 mg by mouth daily.   HYDROcodone-acetaminophen 5-325 MG tablet Commonly known as: NORCO/VICODIN Take 1 tablet by mouth every 6 (six) hours as needed for moderate pain or severe pain.   ibuprofen 800 MG tablet Commonly known as: ADVIL Take 1 tablet (800 mg total) by mouth every 8 (eight) hours as needed.   mirtazapine 7.5 MG tablet Commonly known as: REMERON Take 7.5 mg by mouth at bedtime.   pantoprazole 40 MG tablet Commonly known as: PROTONIX Take 40 mg by mouth daily.         Follow-up Information    Campbell Lerner, MD. Schedule an appointment as soon as possible for a visit in 2 week(s).   Specialty: General Surgery Why: s/p robotic cholecystectomy Contact information:  [address] [place] [address] [phone]                Time spent on discharge management including discussion of hospital course, clinical  condition, outpatient instructions, prescriptions, and follow up with the patient and members of the medical team: >30 minutes  -- Edison Simon , PA-C [facility]  11/09/2019, 9:49 AM [phone] M-F: 7am - 4pm

## 2019-11-09 NOTE — Discharge Instructions (Signed)
Cholecystitis  Cholecystitis is irritation and swelling (inflammation) of the gallbladder. The gallbladder is an organ that is shaped like a pear. It is under the liver on the right side of the body. This organ stores bile. Bile helps the body break down (digest) the fats in food. This condition can occur all of a sudden. It needs to be treated. What are the causes? This condition may be caused by stones or lumps that form in the gallbladder (gallstones). Gallstones can block the tube (duct) that carries bile out of your gallbladder. Other causes are:  Damage to the gallbladder due to less blood flow.  Germs in the bile ducts.  Scars or kinks in the bile ducts.  Abnormal growths (tumors) in the liver, pancreas, or gallbladder. What increases the risk? You are more likely to develop this condition if:  You have sickle cell disease.  You take birth control pills.  You use estrogen.  You have alcoholic liver disease.  You have liver cirrhosis.  You are being fed through a vein.  You are very ill.  You do not eat or drink for a long time. This is also called "fasting."  You are overweight (obese).  You lose weight too fast.  You are pregnant.  You have high levels of fat in the blood (triglycerides).  You have irritation and swelling of the pancreas (pancreatitis). What are the signs or symptoms? Symptoms of this condition include:  Pain in the belly (abdomen). Pain is often in the upper right area of the belly.  Tenderness or bloating in the belly.  Feeling sick to your stomach (nauseous).  Throwing up (vomiting).  Fever.  Chills. How is this diagnosed? This condition may be diagnosed with a medical history and exam. You may also have other tests, such as:  Imaging tests. This may include: ? Ultrasound. ? CT scan of the belly. ? Nuclear scan. This is also called a HIDA scan. This scan lets your doctor see the bile as it moves in your body. ? MRI.  Blood  tests. These are done to check: ? Your blood count. The white blood cell count may be higher than normal. ? How well your liver works. How is this treated? This condition may be treated with:  Surgery to take out your gallbladder.  Antibiotic medicines to treat illnesses caused by germs.  Going without food for some time.  Giving fluids through an IV tube.  Medicines to treat pain or throwing up. Follow these instructions at home:  If you had surgery, follow instructions from your doctor about how to care for yourself after you go home. Medicines   Take over-the-counter and prescription medicines only as told by your doctor.  If you were prescribed an antibiotic medicine, take it as told by your doctor. Do not stop taking it even if you start to feel better. General instructions  Follow instructions from your doctor about what to eat or drink. Do not eat or drink anything that makes you sick again.  Do not lift anything that is heavier than 10 lb (4.5 kg) until your doctor says that it is safe.  Do not use any products that contain nicotine or tobacco, such as cigarettes and e-cigarettes. If you need help quitting, ask your doctor.  Keep all follow-up visits as told by your doctor. This is important. Contact a doctor if:  You have pain and your medicine does not help.  You have a fever. Get help right away if:    Your pain moves to: ? Another part of your belly. ? Your back.  Your symptoms do not go away.  You have new symptoms. Summary  Cholecystitis is swelling and irritation of the gallbladder.  This condition may be caused by stones or lumps that form in the gallbladder (gallstones).  Common symptoms are pain in the belly. You may feel sick to your stomach and start throwing up. You may also have a fever and chills.  This condition may be treated with surgery to take out the gallbladder. It may also be treated with medicines, fasting, and fluids through an IV  tube.  Follow what you are told about eating and drinking. Do not eat things that make you sick again. This information is not intended to replace advice given to you by your health care provider. Make sure you discuss any questions you have with your health care provider. Document Revised: 09/13/2017 Document Reviewed: 09/13/2017 Elsevier Patient Education  2020 Elsevier Inc.  

## 2019-11-09 NOTE — Care Management (Signed)
Patient was discharged home today.  Patient lives at home with wife who has dementia.  Patient states that his son and daughter in law live locally and drive them to appointments.  Patient states that he ambulates with a RW at home.   PCP [facility] Denies issues obtaining medications  Per Bedside RN son was inuring about home health services.  VM left for son and daughter in law.   Home health services discussed with patient.  He is agreeable and states he does not have a preference of agency.  Referral made to [facility] with [facility]. They will submit information to get authorization to [facility]

## 2019-11-09 NOTE — Plan of Care (Signed)
Patient ready for discharge.  Reviewed discharge instructions with son and patient who verbalized udnerstanding. Reviewed medication changes and prescription given.  PIV removed.  Assisted to get dressed and in w/c. Patient's Walker taken at discharge and [facility] card given to patient.

## 2019-11-10 LAB — SURGICAL PATHOLOGY

## 2019-11-10 NOTE — Anesthesia Postprocedure Evaluation (Signed)
Anesthesia Post Note  Patient: Frank Khan  Procedure(s) Performed: XI ROBOTIC ASSISTED LAPAROSCOPIC CHOLECYSTECTOMY (N/A )  Patient location during evaluation: PACU Anesthesia Type: General Level of consciousness: awake and alert and oriented Pain management: pain level controlled Vital Signs Assessment: post-procedure vital signs reviewed and stable Respiratory status: spontaneous breathing Cardiovascular status: blood pressure returned to baseline Anesthetic complications: no   No complications documented.   Last Vitals:  Vitals:   11/09/19 0610 11/09/19 1100  BP: 117/69   Pulse: 94   Resp:    Temp: 36.4 C   SpO2: 100% 96%    Last Pain:  Vitals:   11/09/19 1100  TempSrc:   PainSc: 1                  Briele Lagasse

## 2019-11-11 ENCOUNTER — Telehealth: Payer: Self-pay | Admitting: Internal Medicine

## 2019-11-11 NOTE — Telephone Encounter (Signed)
Lm for pt

## 2019-11-11 NOTE — Telephone Encounter (Signed)
Called and spoke to pt.  Pt stated that his concentrator is not delivering oxygen correctly.  I have recommended that he contact [employer] regarding this matter, as they supply his oxygen.  Pt voiced his understanding.  Nothing further is needed.

## 2019-11-12 ENCOUNTER — Telehealth: Payer: Self-pay | Admitting: Surgery

## 2019-11-12 NOTE — Telephone Encounter (Signed)
Received call from [facility] with [facility] in [place]  [phone]) needing verbal approval for home health nursing for the following duration:  [name] with [facility] verbally approved 1 x weekly for 5 weeks and then 2 prn visits for patient.  She will fax PT order for doctor to sign.

## 2019-11-24 ENCOUNTER — Encounter: Payer: Self-pay | Admitting: Surgery

## 2019-12-02 ENCOUNTER — Emergency Department
Admission: EM | Admit: 2019-12-02 | Discharge: 2019-12-02 | Disposition: A | Discharge status: Home / Self Care | Payer: No Typology Code available for payment source | Attending: Student in an Organized Health Care Education/Training Program | Admitting: Student in an Organized Health Care Education/Training Program

## 2019-12-02 ENCOUNTER — Encounter: Payer: Self-pay | Admitting: Emergency Medicine

## 2019-12-02 ENCOUNTER — Other Ambulatory Visit: Payer: Self-pay

## 2019-12-02 DIAGNOSIS — L309 Dermatitis, unspecified: Secondary | ICD-10-CM | POA: Insufficient documentation

## 2019-12-02 DIAGNOSIS — E1122 Type 2 diabetes mellitus with diabetic chronic kidney disease: Secondary | ICD-10-CM | POA: Diagnosis not present

## 2019-12-02 DIAGNOSIS — Z87891 Personal history of nicotine dependence: Secondary | ICD-10-CM | POA: Insufficient documentation

## 2019-12-02 DIAGNOSIS — I2510 Atherosclerotic heart disease of native coronary artery without angina pectoris: Secondary | ICD-10-CM | POA: Diagnosis not present

## 2019-12-02 DIAGNOSIS — Z7982 Long term (current) use of aspirin: Secondary | ICD-10-CM | POA: Diagnosis not present

## 2019-12-02 DIAGNOSIS — I129 Hypertensive chronic kidney disease with stage 1 through stage 4 chronic kidney disease, or unspecified chronic kidney disease: Secondary | ICD-10-CM | POA: Insufficient documentation

## 2019-12-02 DIAGNOSIS — N183 Chronic kidney disease, stage 3 unspecified: Secondary | ICD-10-CM | POA: Diagnosis not present

## 2019-12-02 DIAGNOSIS — L539 Erythematous condition, unspecified: Secondary | ICD-10-CM | POA: Diagnosis present

## 2019-12-02 DIAGNOSIS — Z8616 Personal history of COVID-19: Secondary | ICD-10-CM | POA: Insufficient documentation

## 2019-12-02 MED ORDER — MICONAZOLE NITRATE 2 % EX CREA
TOPICAL_CREAM | CUTANEOUS | 1 refills | Status: DC
Start: 2019-12-02 — End: 2020-11-08

## 2019-12-02 MED ORDER — MICONAZOLE NITRATE 2 % EX CREA
TOPICAL_CREAM | Freq: Once | CUTANEOUS | Status: AC
  Filled 2019-12-02: qty 14

## 2019-12-02 NOTE — ED Provider Notes (Signed)
[facility] Emergency Department Provider Note ____________________________________________  Time seen: 1055  I have reviewed the triage vital signs and the nursing notes.  HISTORY  Chief Complaint  skin tear  HPI Frank Khan is a 85 y.o. male presents to the ED accompanied by his wife, for evaluation of several areas to the lower back and pelvic that are consistent with itchy areas of irritation to the according to the patient.  Patient denies any falls he is not incontinent, and does not wear undergarments.  He also denies any fever, chills, sweats.  Patient has areas of the buttocks and sacrum that are itchy and the skin appears erythematous.  No weeping or fevers or purulent drainage is reported.   Past Medical History:  Diagnosis Date  . Diabetes mellitus without complication (HCC)   . Hypertension   . Pulmonary fibrosis (HCC)   . TIA (transient ischemic attack)     Patient Active Problem List   Diagnosis Date Noted  . Acute cholecystitis due to biliary calculus 11/08/2019  . TIA (transient ischemic attack)   . Pulmonary fibrosis (HCC)   . Hypertension   . Diabetes mellitus without complication (HCC)   . Difficulty speaking   . Hypoglycemia   . COVID-19   . CKD (chronic kidney disease), stage III   . HLD (hyperlipidemia)   . GERD (gastroesophageal reflux disease)   . Depression   . CAD (coronary artery disease)     Past Surgical History:  Procedure Laterality Date  . CORONARY ANGIOPLASTY WITH STENT PLACEMENT      Prior to Admission medications   Medication Sig Start Date End Date Taking? Authorizing Provider  aspirin 81 MG chewable tablet Chew 81 mg by mouth daily.    [provider]  cetirizine (ZYRTEC) 10 MG tablet Take 10 mg by mouth daily.    [provider]  ezetimibe (ZETIA) 10 MG tablet Take 10 mg by mouth daily.    [provider]  HYDROcodone-acetaminophen (NORCO/VICODIN) 5-325 MG tablet Take 1 tablet  by mouth every 6 (six) hours as needed for moderate pain or severe pain. 11/09/19   Donovan Kail, PA-C  ibuprofen (ADVIL) 800 MG tablet Take 1 tablet (800 mg total) by mouth every 8 (eight) hours as needed. 11/09/19   Donovan Kail, PA-C  miconazole (MICATIN) 2 % cream Apply to affected area 2 times daily 12/02/19 12/31/20  Danford Tat, Charlesetta Ivory, PA-C  mirtazapine (REMERON) 7.5 MG tablet Take 7.5 mg by mouth at bedtime.    [provider]  pantoprazole (PROTONIX) 40 MG tablet Take 40 mg by mouth daily.    [provider]    Allergies Doxycycline, Penicillins, Simvastatin, Chlorhexidine, and Iodine  Family History  Problem Relation Age of Onset  . Hyperlipidemia Sister     Social History Social History   Tobacco Use  . Smoking status: Former Games developer  . Smokeless tobacco: Never Used  Vaping Use  . Vaping Use: Never used  Substance Use Topics  . Alcohol use: No  . Drug use: No    Review of Systems  Constitutional: Negative for fever. Cardiovascular: Negative for chest pain. Respiratory: Negative for shortness of breath. Gastrointestinal: Negative for abdominal pain, vomiting and diarrhea. Genitourinary: Negative for dysuria. Musculoskeletal: Negative for back pain. Skin: Negative for rash.  Skin irritation as above. Neurological: Negative for headaches, focal weakness or numbness. ____________________________________________  PHYSICAL EXAM:  VITAL SIGNS: ED Triage Vitals  Enc Vitals Group  BP 12/02/19 1016 115/76     Pulse Rate 12/02/19 1016 91     Resp 12/02/19 1016 18     Temp 12/02/19 1016 97.9 F (36.6 C)     Temp Source 12/02/19 1016 Oral     SpO2 12/02/19 1016 93 %     Weight 12/02/19 1020 120 lb (54.4 kg)     Height 12/02/19 1020 6' (1.829 m)     Head Circumference --      Peak Flow --      Pain Score 12/02/19 1020 0     Pain Loc --      Pain Edu? --      Excl. in GC? --     Constitutional: Alert and oriented. Well appearing  and in no distress. Head: Normocephalic and atraumatic. Eyes: Conjunctivae are normal. Normal extraocular movements Cardiovascular: Normal rate, regular rhythm. Normal distal pulses. Respiratory: Normal respiratory effort. No wheezes/rales/rhonchi. Gastrointestinal: Soft and nontender. No distention. Musculoskeletal: Nontender with normal range of motion in all extremities.  Neurologic:  Normal gait without ataxia. Normal speech and language. No gross focal neurologic deficits are appreciated. Skin:  Skin is warm, dry and intact. No rash noted.  Patient with several areas of well-circumscribed erythematous skin with some superficial skin peeling.  Patient notes the same area is also extremely pruritic in nature.  No weeping, induration, purulence, or warmth is noted. ____________________________________________  PROCEDURES  Miconazole 2% topical cream. Procedures ____________________________________________  INITIAL IMPRESSION / ASSESSMENT AND PLAN / ED COURSE  Geriatric patient with ED evaluation of several areas of the skin of the trunk and sacrum likely representing yeast dermatitis.  Areas are covered with miconazole cream and nonstick bandage.  Patient discharged with prescription for the same, and wound care supplies.  His wife is given instruction on management of his skin changes.  To follow with primary provider return to the ED if necessary.  MARCELLOUS Khan was evaluated in Emergency Department on 12/02/2019 for the symptoms described in the history of present illness. He was evaluated in the context of the global COVID-19 pandemic, which necessitated consideration that the patient might be at risk for infection with the SARS-CoV-2 virus that causes COVID-19. Institutional protocols and algorithms that pertain to the evaluation of patients at risk for COVID-19 are in a state of rapid change based on information released by regulatory bodies including the CDC and federal and state  organizations. These policies and algorithms were followed during the patient's care in the ED. ____________________________________________  FINAL CLINICAL IMPRESSION(S) / ED DIAGNOSES  Final diagnoses:  Dermatitis      Karmen Stabs, Charlesetta Ivory, PA-C 12/02/19 1918    Willy Eddy, MD 12/03/19 [phone]

## 2019-12-02 NOTE — ED Triage Notes (Signed)
Pt scratching the skin tear area, reports it itches.

## 2019-12-02 NOTE — ED Notes (Signed)
See triage note  Presents with wife  States he has several red sore ares to back,left hip and inner thigh

## 2019-12-02 NOTE — Discharge Instructions (Signed)
Use the topical antifungal cream to the inflamed areas, twice daily. Follow-up with your provider or return to the ED as needed. Keep the areas, clean, dry, and covered as needed.

## 2019-12-02 NOTE — ED Triage Notes (Signed)
Pt reports skin tear on his left lower back for 2-3 days. Pt unsure of how it got there. Area appears to be a blister that popped.

## 2019-12-03 ENCOUNTER — Ambulatory Visit
Admission: RE | Admit: 2019-12-03 | Discharge: 2019-12-03 | Disposition: A | Discharge status: Home / Self Care | Payer: No Typology Code available for payment source | Source: Ambulatory Visit | Attending: Internal Medicine | Admitting: Internal Medicine

## 2019-12-03 DIAGNOSIS — J439 Emphysema, unspecified: Secondary | ICD-10-CM | POA: Diagnosis not present

## 2019-12-03 DIAGNOSIS — J8410 Pulmonary fibrosis, unspecified: Secondary | ICD-10-CM | POA: Insufficient documentation

## 2020-01-03 NOTE — Progress Notes (Signed)
Frank Khan, his CT July 2021 shows ILD with progression but he has declined Rx. He has a possible mass and radiology is ecommending 3 month followup CT .   Plan  - change sept 2021 visit to mid oct 2021 but after ct chest without contrast - can keep sept 2021 visit if he has symptom needs to address from resp standpoint

## 2020-01-04 ENCOUNTER — Other Ambulatory Visit: Payer: Self-pay

## 2020-01-04 ENCOUNTER — Telehealth: Payer: Self-pay | Admitting: Internal Medicine

## 2020-01-04 DIAGNOSIS — R918 Other nonspecific abnormal finding of lung field: Secondary | ICD-10-CM

## 2020-01-04 DIAGNOSIS — R911 Solitary pulmonary nodule: Secondary | ICD-10-CM

## 2020-01-04 NOTE — Telephone Encounter (Signed)
Patient asking about a medication Omega XL, patient referred to his pharmacy to ask questions about this medication.

## 2020-01-22 ENCOUNTER — Ambulatory Visit: Payer: No Typology Code available for payment source | Admitting: Internal Medicine

## 2020-01-31 ENCOUNTER — Other Ambulatory Visit: Payer: Self-pay

## 2020-01-31 ENCOUNTER — Encounter: Payer: Self-pay | Admitting: Emergency Medicine

## 2020-01-31 ENCOUNTER — Emergency Department
Admission: EM | Admit: 2020-01-31 | Discharge: 2020-01-31 | Disposition: A | Discharge status: Home / Self Care | Payer: No Typology Code available for payment source | Attending: Emergency Medicine | Admitting: Emergency Medicine

## 2020-01-31 DIAGNOSIS — Z7982 Long term (current) use of aspirin: Secondary | ICD-10-CM | POA: Diagnosis not present

## 2020-01-31 DIAGNOSIS — I129 Hypertensive chronic kidney disease with stage 1 through stage 4 chronic kidney disease, or unspecified chronic kidney disease: Secondary | ICD-10-CM | POA: Insufficient documentation

## 2020-01-31 DIAGNOSIS — B029 Zoster without complications: Secondary | ICD-10-CM

## 2020-01-31 DIAGNOSIS — N183 Chronic kidney disease, stage 3 unspecified: Secondary | ICD-10-CM | POA: Insufficient documentation

## 2020-01-31 DIAGNOSIS — E119 Type 2 diabetes mellitus without complications: Secondary | ICD-10-CM | POA: Diagnosis not present

## 2020-01-31 DIAGNOSIS — R21 Rash and other nonspecific skin eruption: Secondary | ICD-10-CM | POA: Diagnosis present

## 2020-01-31 DIAGNOSIS — Z87891 Personal history of nicotine dependence: Secondary | ICD-10-CM | POA: Insufficient documentation

## 2020-01-31 DIAGNOSIS — I2510 Atherosclerotic heart disease of native coronary artery without angina pectoris: Secondary | ICD-10-CM | POA: Diagnosis not present

## 2020-01-31 DIAGNOSIS — Z79899 Other long term (current) drug therapy: Secondary | ICD-10-CM | POA: Diagnosis not present

## 2020-01-31 DIAGNOSIS — N481 Balanitis: Secondary | ICD-10-CM

## 2020-01-31 HISTORY — DX: Unspecified dementia, unspecified severity, without behavioral disturbance, psychotic disturbance, mood disturbance, and anxiety: F03.90

## 2020-01-31 LAB — URINALYSIS, ROUTINE W REFLEX MICROSCOPIC
Bacteria, UA: NONE SEEN
Bilirubin Urine: NEGATIVE
Glucose, UA: NEGATIVE mg/dL
Ketones, ur: NEGATIVE mg/dL
Leukocytes,Ua: NEGATIVE
Nitrite: NEGATIVE
Protein, ur: NEGATIVE mg/dL
Specific Gravity, Urine: 1.005 (ref 1.005–1.030)
Squamous Epithelial / HPF: NONE SEEN (ref 0–5)
pH: 5 (ref 5.0–8.0)

## 2020-01-31 LAB — CBC WITH DIFFERENTIAL/PLATELET
Abs Immature Granulocytes: 0.02 10*3/uL (ref 0.00–0.07)
Basophils Absolute: 0 10*3/uL (ref 0.0–0.1)
Basophils Relative: 1 %
Eosinophils Absolute: 0.4 10*3/uL (ref 0.0–0.5)
Eosinophils Relative: 6 %
HCT: 38 % — ABNORMAL LOW (ref 39.0–52.0)
Hemoglobin: 13.4 g/dL (ref 13.0–17.0)
Immature Granulocytes: 0 %
Lymphocytes Relative: 11 %
Lymphs Abs: 0.8 10*3/uL (ref 0.7–4.0)
MCH: 31.5 pg (ref 26.0–34.0)
MCHC: 35.3 g/dL (ref 30.0–36.0)
MCV: 89.2 fL (ref 80.0–100.0)
Monocytes Absolute: 0.8 10*3/uL (ref 0.1–1.0)
Monocytes Relative: 11 %
Neutro Abs: 5.3 10*3/uL (ref 1.7–7.7)
Neutrophils Relative %: 71 %
Platelets: 198 10*3/uL (ref 150–400)
RBC: 4.26 MIL/uL (ref 4.22–5.81)
RDW: 13.1 % (ref 11.5–15.5)
WBC: 7.3 10*3/uL (ref 4.0–10.5)
nRBC: 0 % (ref 0.0–0.2)

## 2020-01-31 LAB — COMPREHENSIVE METABOLIC PANEL
ALT: 11 U/L (ref 0–44)
AST: 25 U/L (ref 15–41)
Albumin: 3.5 g/dL (ref 3.5–5.0)
Alkaline Phosphatase: 86 U/L (ref 38–126)
Anion gap: 9 (ref 5–15)
BUN: 17 mg/dL (ref 8–23)
CO2: 28 mmol/L (ref 22–32)
Calcium: 9.2 mg/dL (ref 8.9–10.3)
Chloride: 101 mmol/L (ref 98–111)
Creatinine, Ser: 1.01 mg/dL (ref 0.61–1.24)
GFR calc Af Amer: >60 mL/min (ref >60)
GFR calc non Af Amer: >60 mL/min (ref >60)
Glucose, Bld: 116 mg/dL — ABNORMAL HIGH (ref 70–99)
Potassium: 3.8 mmol/L (ref 3.5–5.1)
Sodium: 138 mmol/L (ref 135–145)
Total Bilirubin: 0.9 mg/dL (ref 0.3–1.2)
Total Protein: 7.1 g/dL (ref 6.5–8.1)

## 2020-01-31 LAB — LACTIC ACID, PLASMA: Lactic Acid, Venous: 1.5 mmol/L (ref 0.5–1.9)

## 2020-01-31 MED ORDER — VALACYCLOVIR HCL 1 G PO TABS: 1000.0000 mg | ORAL_TABLET | Freq: Three times a day (TID) | ORAL | 0 refills | Status: AC

## 2020-01-31 MED ORDER — CLOTRIMAZOLE-BETAMETHASONE 1-0.05 % EX CREA: TOPICAL_CREAM | CUTANEOUS | 1 refills | Status: DC

## 2020-01-31 MED ORDER — VALACYCLOVIR HCL 500 MG PO TABS
1000.0000 mg | ORAL_TABLET | ORAL | Status: AC
  Administered 2020-01-31: 1000 mg via ORAL
  Filled 2020-01-31: qty 2

## 2020-01-31 MED ORDER — TRIPLE ANTIBIOTIC 5-400-5000 EX OINT: TOPICAL_OINTMENT | Freq: Four times a day (QID) | CUTANEOUS | 0 refills | Status: DC

## 2020-01-31 MED ORDER — CLOTRIMAZOLE-BETAMETHASONE 1-0.05 % EX CREA
TOPICAL_CREAM | CUTANEOUS | Status: AC
  Filled 2020-01-31: qty 15

## 2020-01-31 MED ORDER — BACITRACIN-NEOMYCIN-POLYMYXIN 400-5-5000 EX OINT
TOPICAL_OINTMENT | CUTANEOUS | Status: AC
  Filled 2020-01-31: qty 1

## 2020-01-31 MED ORDER — TRAMADOL HCL 50 MG PO TABS: 100.0000 mg | ORAL_TABLET | Freq: Four times a day (QID) | ORAL | 0 refills | Status: DC | PRN

## 2020-01-31 NOTE — Discharge Instructions (Addendum)
We believe that Frank Khan has 2 different issues. The rash on his abdomen and around his side is most likely an early presentation of shingles. He needs to take the prescription for valacyclovir 3 times a day for a week which should help shorten the course of his infection and reduce the pain.  Additionally, he seems to have an infection of the skin of the penis called balanitis. For this he needs to apply the prescribed Lotrisone cream two daily to the affected area of his penis until the symptoms improve.  He also needs to apply Neosporin antibiotic ointment to the same area as many as four times a day (though both medications can be applied simultaneously).  Please follow-up with his primary care provider at the next opportunity. I also provided information regarding a urologist that you can call on Monday morning to schedule a follow-up appointment for him to reassess the balanitis and penile symptoms.    Return to the emergency department if you develop new or worsening symptoms that concern you.

## 2020-01-31 NOTE — ED Triage Notes (Signed)
Pt from home via [facility]. Pt c/o rash over trunk, arms, back, and penis. Pt states "it burns and hurts". Pt recently put on cipro for a UTI.  Pt's son is PoA. Hx of dementia.  NAD noted.

## 2020-01-31 NOTE — ED Notes (Addendum)
Pt up to bathroom at this time with assistance of NT.

## 2020-01-31 NOTE — ED Provider Notes (Signed)
[facility] Emergency Department Provider Note  ____________________________________________   First MD Initiated Contact with Patient 01/31/20 0028     (approximate)  I have reviewed the triage vital signs and the nursing notes.   HISTORY  Chief Complaint Rash  Level 5 caveat:  history/ROS limited by chronic dementia  HPI Frank Khan is a 85 y.o. male with medical history as listed below who presents by EMS for evaluation of possible drug rash or allergic reaction.  Based on the report from paramedics as well as a review of the medical record, it sounds as if he has had several days of increased urinary frequency and painful urination.  A phone call was placed to his primary care provider who prescribed Cipro.    The following day he called back and said that the head of the penis was very sore and painful but it is unclear if any additional medications or treatments were prescribed.  EMS was called tonight because of a painful red rash on his lower abdomen below the umbilicus as well as a couple of spots along his right side.  He is not able to provide additional history and his biggest complaint is the pain to his penis.  He also says that the lesions on his back inside and below the bellybutton are very painful.  He has no painful lesions in his mouth.  He denies shortness of breath, difficulty swallowing, chest pain, nausea, vomiting, and abdominal pain.  He describes the pain in his penis as sharp and severe.  Touching it or moving around makes it worse, nothing in particular makes it better.        Past Medical History:  Diagnosis Date  . COVID-19 08/01/2019   Diagnosed on 08/01/2019  . Dementia (HCC)   . Diabetes mellitus without complication (HCC)   . Hypertension   . Pulmonary fibrosis (HCC)   . TIA (transient ischemic attack)     Patient Active Problem List   Diagnosis Date Noted  . Acute cholecystitis due to biliary calculus 11/08/2019  .  TIA (transient ischemic attack)   . Pulmonary fibrosis (HCC)   . Hypertension   . Diabetes mellitus without complication (HCC)   . Difficulty speaking   . Hypoglycemia   . COVID-19   . CKD (chronic kidney disease), stage III   . HLD (hyperlipidemia)   . GERD (gastroesophageal reflux disease)   . Depression   . CAD (coronary artery disease)     Past Surgical History:  Procedure Laterality Date  . CORONARY ANGIOPLASTY WITH STENT PLACEMENT      Prior to Admission medications   Medication Sig Start Date End Date Taking? Authorizing Provider  aspirin 81 MG chewable tablet Chew 81 mg by mouth daily.    [provider]  cetirizine (ZYRTEC) 10 MG tablet Take 10 mg by mouth daily.    [provider]  clotrimazole-betamethasone (LOTRISONE) cream Apply to affected area 2 times daily 01/31/20 01/30/21  Loleta Rose, MD  ezetimibe (ZETIA) 10 MG tablet Take 10 mg by mouth daily.    [provider]  HYDROcodone-acetaminophen (NORCO/VICODIN) 5-325 MG tablet Take 1 tablet by mouth every 6 (six) hours as needed for moderate pain or severe pain. 11/09/19   Donovan Kail, PA-C  ibuprofen (ADVIL) 800 MG tablet Take 1 tablet (800 mg total) by mouth every 8 (eight) hours as needed. 11/09/19   Donovan Kail, PA-C  miconazole (MICATIN) 2 % cream Apply to affected area  2 times daily 12/02/19 12/31/20  Menshew, Charlesetta Ivory, PA-C  mirtazapine (REMERON) 7.5 MG tablet Take 7.5 mg by mouth at bedtime.    [provider]  neomycin-bacitracin-polymyxin (NEOSPORIN) [phone] ointment Apply topically 4 (four) times daily. 01/31/20   Loleta Rose, MD  pantoprazole (PROTONIX) 40 MG tablet Take 40 mg by mouth daily.    [provider]  traMADol (ULTRAM) 50 MG tablet Take 2 tablets (100 mg total) by mouth every 6 (six) hours as needed for moderate pain or severe pain. 01/31/20   Loleta Rose, MD  valACYclovir (VALTREX) 1000 MG tablet Take 1 tablet (1,000 mg total) by  mouth 3 (three) times daily for 7 days. 01/31/20 02/07/20  Loleta Rose, MD    Allergies Doxycycline, Penicillins, Simvastatin, Chlorhexidine, and Iodine  Family History  Problem Relation Age of Onset  . Hyperlipidemia Sister     Social History Social History   Tobacco Use  . Smoking status: Former Games developer  . Smokeless tobacco: Never Used  Vaping Use  . Vaping Use: Never used  Substance Use Topics  . Alcohol use: No  . Drug use: No    Review of Systems Level 5 caveat:  history/ROS limited by chronic dementia   constitutional: No fever/chills Eyes: No visual changes. ENT: No sore throat. Cardiovascular: Denies chest pain. Respiratory: Denies shortness of breath. Gastrointestinal: No abdominal pain.  No nausea, no vomiting.  No diarrhea.  No constipation. Genitourinary: Increased urinary frequency and dysuria with a painful and swollen penis Musculoskeletal: Negative for neck pain.  Negative for back pain. Integumentary: Red painful lesions below his bellybutton and scattered along his side into the back. Neurological: Negative for headaches, focal weakness or numbness.   ____________________________________________   PHYSICAL EXAM:  VITAL SIGNS: ED Triage Vitals  Enc Vitals Group     BP 01/31/20 0042 129/62     Pulse Rate 01/31/20 0042 100     Resp 01/31/20 0042 20     Temp 01/31/20 0042 97.9 F (36.6 C)     Temp Source 01/31/20 0042 Oral     SpO2 01/31/20 0042 100 %     Weight 01/31/20 0112 54.2 kg (119 lb 7.8 oz)     Height 01/31/20 0112 1.829 m (6')     Head Circumference --      Peak Flow --      Pain Score 01/31/20 0111 10     Pain Loc --      Pain Edu? --      Excl. in GC? --     Constitutional: Alert and oriented to person and place. Eyes: Conjunctivae are normal.  Head: Atraumatic. Nose: No congestion/rhinnorhea. Mouth/Throat: Patient is wearing a mask.  No oral lesions and no mucosal involvement to suggest SJS/TEN. Neck: No stridor.  No  meningeal signs.   Cardiovascular: Borderline tachycardia, regular rhythm. Good peripheral circulation. Grossly normal heart sounds. Respiratory: Normal respiratory effort.  No retractions. Gastrointestinal: Soft and nontender. No distention.  Genitourinary: Circumcised male.  Penile edema with erythema and irritation particularly of the glans most consistent with balanitis.  No obvious discharge from the urethra.  No scrotal involvement including no edema or erythema of the scrotum, no tenderness to palpation of the scrotum, no perineal changes to suggest Fournier's gangrene. Musculoskeletal: No lower extremity tenderness nor edema. No gross deformities of extremities. Neurologic:  Normal speech and language. No gross focal neurologic deficits are appreciated.  Skin:  Skin is warm and dry.  The patient has a several  centimeter in diameter erythematous and very tender area below the umbilicus that has a maculopapular appearance but not quite vesicular.  He has several smaller lesions scattered along his right side and a roughly dermatomal distribution (about T10-11).  He has an area of erythema right along the mid thoracic spine but I believe this is due to the pressure of him lying back in the stretcher because this area is nontender whereas the scattered lesions described above are very tender to palpation and consistent with allodynia.  Overall the lesions appear most consistent with early shingles (herpes zoster).   ____________________________________________   LABS (all labs ordered are listed, but only abnormal results are displayed)  Labs Reviewed  URINALYSIS, ROUTINE W REFLEX MICROSCOPIC - Abnormal; Notable for the following components:      Result Value   Color, Urine YELLOW (*)    APPearance CLEAR (*)    Hgb urine dipstick SMALL (*)    All other components within normal limits  CBC WITH DIFFERENTIAL/PLATELET - Abnormal; Notable for the following components:   HCT 38.0 (*)    All  other components within normal limits  COMPREHENSIVE METABOLIC PANEL - Abnormal; Notable for the following components:   Glucose, Bld 116 (*)    All other components within normal limits  URINE CULTURE  LACTIC ACID, PLASMA   ____________________________________________  EKG  No indication for emergent EKG ____________________________________________  RADIOLOGY I, Loleta Rose, personally viewed and evaluated these images (plain radiographs) as part of my medical decision making, as well as reviewing the written report by the radiologist.  ED MD interpretation: No indication for emergent imaging  Official radiology report(s): No results found.  ____________________________________________   PROCEDURES   Procedure(s) performed (including Critical Care):  Procedures   ____________________________________________   INITIAL IMPRESSION / MDM / ASSESSMENT AND PLAN / ED COURSE  As part of my medical decision making, I reviewed the following data within the electronic MEDICAL RECORD NUMBER History obtained from family, Nursing notes reviewed and incorporated, Labs reviewed , Old chart reviewed and Notes from prior ED visits   Differential diagnosis includes, but is not limited to, drug reaction including SJS/TEN, allergic reaction, cellulitis, herpes zoster (shingles), balanitis, UTI, electrolyte or metabolic abnormality.  The lesions on the patient's trunk are most consistent with herpes zoster although it appears early and not yet fully developed into the vesicular lesions.  However they are very tender to palpation and are in a roughly dermatomal distribution (T10 or T11).  It is possible that there is sacral involvement as well although he does not appear ill enough to be more of a disseminated zoster infection.  However the penile issue does not appear consistent with shingles and instead has more of appearance of balanitis.  He does not have a phimosis or paraphimosis.  I suspect  the penile infection is more of a fungal and/or bacterial superinfection and may be completely unrelated to the lesions seen on his trunk.  I am checking basic lab work including CMP, CBC, lactic acid, urinalysis, and I sent a urine culture.  I believe that the Cipro was ordered empirically for his dysuria and increased urinary frequency but this may have been the result of the balanitis and associated discomfort.  I will consider the antibiotic therapy after seeing the results of his test but anticipate changing him or perhaps discontinuing completely the Cipro.  I will reassess after the lab work is complete to determine if he is appropriate for discharge home.  Clinical Course as of Jan 31 347  Sun Jan 31, 2020  [id number] Lactic Acid, Venous: 1.5 [CF]  0247 Normal UA  Urinalysis, Routine w reflex microscopic Urine, Clean Catch(!) [CF]  0247 Normal CMP  Comprehensive metabolic panel(!) [CF]  0309 The patient appears to have both balanitis and early herpes zoster.  At this point there is no indication for admission to the hospital.  I called and spoke with his son, Particia JasperMike Boch, who is his healthcare power of attorney.  I went over the patient's symptoms, differential diagnosis, evaluation, and results, and his son understands the plan in terms of the treatment for the balanitis and the Valtrex for the shingles.  I explained the need for close follow-up with his PCP and/or with urology and this understands that as well.  He will be here in about 45 minutes to pick him up to take him home.   [CF]    Clinical Course User Index [CF] Loleta RoseForbach, Joshus Rogan, MD     ____________________________________________  FINAL CLINICAL IMPRESSION(S) / ED DIAGNOSES  Final diagnoses:  Herpes zoster without complication  Balanitis     MEDICATIONS GIVEN DURING THIS VISIT:  Medications  valACYclovir (VALTREX) tablet 1,000 mg (has no administration in time range)  clotrimazole-betamethasone (LOTRISONE) cream (has no  administration in time range)  neomycin-bacitracin-polymyxin (NEOSPORIN) ointment packet (has no administration in time range)     ED Discharge Orders         Ordered    clotrimazole-betamethasone (LOTRISONE) cream        01/31/20 0339    neomycin-bacitracin-polymyxin (NEOSPORIN) [phone] ointment  4 times daily        01/31/20 0339    valACYclovir (VALTREX) 1000 MG tablet  3 times daily        01/31/20 0339    traMADol (ULTRAM) 50 MG tablet  Every 6 hours PRN        01/31/20 0347          *Please note:  Frank Khan was evaluated in Emergency Department on 01/31/2020 for the symptoms described in the history of present illness. He was evaluated in the context of the global COVID-19 pandemic, which necessitated consideration that the patient might be at risk for infection with the SARS-CoV-2 virus that causes COVID-19. Institutional protocols and algorithms that pertain to the evaluation of patients at risk for COVID-19 are in a state of rapid change based on information released by regulatory bodies including the CDC and federal and state organizations. These policies and algorithms were followed during the patient's care in the ED.  Some ED evaluations and interventions may be delayed as a result of limited staffing during and after the pandemic.*  Note:  This document was prepared using Dragon voice recognition software and may include unintentional dictation errors.   Loleta RoseForbach, Barnet Benavides, MD 01/31/20 [phone]

## 2020-01-31 NOTE — ED Notes (Signed)
Pt unable to sugn for DC due to unavailability of signature pad. Pt's son verbally acknowledged all information gone over with him by this RN and by MD York Cerise.

## 2020-01-31 NOTE — ED Notes (Signed)
Pt assisted to bathroom by Elijah Birk, RN. Urine specimen obtained. Pt returned back to hallway stretcher. Given warm blankets.

## 2020-02-01 LAB — URINE CULTURE: Culture: <10000 — AB

## 2020-03-07 ENCOUNTER — Ambulatory Visit: Payer: No Typology Code available for payment source

## 2020-03-18 ENCOUNTER — Ambulatory Visit: Payer: No Typology Code available for payment source | Attending: Internal Medicine

## 2020-06-09 ENCOUNTER — Other Ambulatory Visit: Payer: Self-pay | Admitting: Pulmonary Disease

## 2020-11-02 ENCOUNTER — Telehealth: Payer: Self-pay | Admitting: Primary Care

## 2020-11-02 NOTE — Telephone Encounter (Signed)
Spoke with patient briefly regarding the Palliative referral/services and he requested that I call and speak with his son Frank Khan, who has POA.  Spoke with Frank Khan regarding the referral and all questions were answered and he was in agreement with starting Palliative services.  I have scheduled an In-home Consult for 11/07/20 @ 1 PM.

## 2020-11-07 ENCOUNTER — Other Ambulatory Visit: Payer: Self-pay | Admitting: Primary Care

## 2020-11-07 ENCOUNTER — Other Ambulatory Visit: Payer: Self-pay

## 2020-11-08 ENCOUNTER — Other Ambulatory Visit: Payer: Self-pay

## 2020-11-08 ENCOUNTER — Other Ambulatory Visit: Payer: Self-pay | Admitting: Primary Care

## 2020-11-08 DIAGNOSIS — Z515 Encounter for palliative care: Secondary | ICD-10-CM

## 2020-11-08 DIAGNOSIS — F329 Major depressive disorder, single episode, unspecified: Secondary | ICD-10-CM

## 2020-11-08 NOTE — Progress Notes (Signed)
[facility] Consult Note Telephone: [phone]  Fax: [phone]    Date of encounter: 11/08/20 PATIENT NAME: Frank Khan [id number]   [phone] (home)  DOB: 10-Jan-1935 MRN: [id number] PRIMARY CARE PROVIDER:    Rusty Aus, MD [place] [address] [phone]   REFERRING PROVIDER:   Rusty Aus, MD [place] [address] [phone]  RESPONSIBLE PARTY:    Contact Information     Name Relation Home Work Baxter Son   [phone]   Vidaurri,james Relative   [phone]        I met face to face with patient and family  son and granddaughter, in  home. Palliative Care was asked to follow this patient by consultation request of Rusty Aus, MD   to address advance care planning and complex medical decision making. This is the initial visit.                                     ASSESSMENT AND PLAN / RECOMMENDATIONS:   Advance Care Planning/Goals of Care: Goals include to maximize quality of life and symptom management. Our advance care planning conversation included a discussion about:    The value and importance of advance care planning  Experiences with loved ones who have been seriously ill or have died - wife died a few weeks ago. Exploration of personal, cultural or spiritual beliefs that might influence medical decisions  Exploration of goals of care in the event of a sudden injury or illness  Identification of a healthcare agent - son Review a of an  advance directive document- did not want to discuss. Became tearful. Discussed MOST with son. Will discuss on next visit.  CODE STATUS: TBD  Symptom Management/Plan:  Depression:Recent loss of wife. She was his caregiver and now other family are filling in.  They feel him uncooperative with care. I referred  him/family to begin bereavement services ASAP, [facility] will reach out. We reviewed meds, family was unclear about anti depressants current d/c and upcoming. Please clarify.  I feel he will benefit from medication and talk therapy as well.  Caregiver strain: [place], as wife was caregiver with limitations but they 'cared for each other". Her demise was precipitous and there was not a plan in case of this. Currently they are looking at [facility] caregivers, family caregivers, and possible hiring. They considered facility but had not reviewed cost of that yet. I encouraged them to go ahead and education themselves about cost and facility services, and offered the caveat that placement would have its own set of issues as well. Referred to [facility] for services.  Hygiene: Pt has never had a bath or shower, just "birth baths". Recommended non rinse body wash. Also recommended zinc for sacrum as it is red and stage 1 Pressure injury. They area using petroleum and lanolin; they could use all 3.   Nutrition: Cachectic, does not want to get up to eat at table. Family thinks he may be trying to fade. I asked him what did he think about during the day and he said his wife and Jesus. He may benefit from appetite enhancer.  Follow up Palliative Care Visit: Palliative care will continue to follow for complex medical decision making, advance care planning, and  clarification of goals. Return 4-6 weeks or prn.  I spent 75 minutes providing this consultation. More than 50% of the time in this consultation was spent in counseling and care coordination.  PPS: 50%  HOSPICE ELIGIBILITY/DIAGNOSIS: TBD  Chief Complaint: depression, anorexia  HISTORY OF PRESENT ILLNESS:  Frank Khan is a 86 y.o. year old male  with depression, anorexia, fibrotic lung disease, oxygen dependence .   History obtained from review of EMR, discussion with primary team, and interview with family, facility staff/caregiver and/or Mr.  Martensen.  I reviewed available labs, medications, imaging, studies and related documents from the EMR.  Records reviewed and summarized above.   ROS/family    General: NAD ENMT: denies dysphagia Cardiovascular: denies chest pain,endorses DOE Pulmonary: endorses cough, endorses increased SOB Abdomen: endorses poor appetite, endorses continence of bowel GU: denies dysuria, endorses continence of urine MSK:  endorses weakness,  no falls reported Skin: reports sacral redness Neurological: denies pain, denies insomnia Psych: Endorses  flat mood Heme/lymph/immuno: denies bruises, abnormal bleeding  Physical Exam: Current and past weights: 118 lb, stable slow decline over some years.  Constitutional: NAD General: frail appearing, thin EYES: anicteric sclera, lids intact, no discharge  ENMT: hard of  hearing, oral mucous membranes moist Pulmonary:  increased work of breathing, + cough,  oxygen 2 L , PO2= 95% Abdomen: intake 50%, no ascites GU: deferred MSK: severe sarcopenia, moves all extremities, ambulatory with device Skin: warm and dry, no rashes or wounds on visible skin Neuro:  ++generalized weakness,  mild to mod cognitive impairment Psych: anxious affect, A and O x 2-3 Hem/lymph/immuno: no widespread bruising   CURRENT PROBLEM LIST:  Patient Active Problem List   Diagnosis Date Noted   Acute cholecystitis due to biliary calculus 11/08/2019   TIA (transient ischemic attack)    Pulmonary fibrosis ([place])    Hypertension    Diabetes mellitus without complication ([place])    Difficulty speaking    Hypoglycemia    COVID-19    CKD (chronic kidney disease), stage III (HCC)    HLD (hyperlipidemia)    GERD (gastroesophageal reflux disease)    Depression    CAD (coronary artery disease)    PAST MEDICAL HISTORY:  Active Ambulatory Problems    Diagnosis Date Noted   TIA (transient ischemic attack)    Pulmonary fibrosis (HCC)    Hypertension    Diabetes mellitus without  complication ([place])    Difficulty speaking    Hypoglycemia    COVID-19    CKD (chronic kidney disease), stage III (HCC)    HLD (hyperlipidemia)    GERD (gastroesophageal reflux disease)    Depression    CAD (coronary artery disease)    Acute cholecystitis due to biliary calculus 11/08/2019   Resolved Ambulatory Problems    Diagnosis Date Noted   No Resolved Ambulatory Problems   Past Medical History:  Diagnosis Date   Dementia ([place])    SOCIAL HX:  Social History   Tobacco Use   Smoking status: Former    Pack years: 0.00   Smokeless tobacco: Never  Substance Use Topics   Alcohol use: No   FAMILY HX:  Family History  Problem Relation Age of Onset   Hyperlipidemia Sister       ALLERGIES:  Allergies  Allergen Reactions   Doxycycline     [facility], [facility] not [facility]    Penicillins Hives    Has patient had a PCN reaction causing immediate rash, facial/tongue/throat swelling, SOB or lightheadedness  with hypotension: yes Has patient had a PCN reaction causing severe rash involving mucus membranes or skin necrosis: no Has patient had a PCN reaction that required hospitalization no Has patient had a PCN reaction occurring within the last 10 years: no If all of the above answers are "NO", then may proceed with Cephalosporin use.     Simvastatin     [facility] records, reaction not given   Chlorhexidine Itching   Iodine Rash     PERTINENT MEDICATIONS:  Outpatient Encounter Medications as of 11/08/2020  Medication Sig   aspirin 81 MG chewable tablet Chew 81 mg by mouth daily.   cetirizine (ZYRTEC) 10 MG tablet Take 10 mg by mouth daily.   ezetimibe (ZETIA) 10 MG tablet Take 10 mg by mouth daily.   hydrOXYzine (ATARAX/VISTARIL) 25 MG tablet Take 25 mg by mouth 2 (two) times daily as needed for itching.   ibuprofen (ADVIL) 800 MG tablet Take 1 tablet (800 mg total) by mouth every 8 (eight) hours as needed.   pantoprazole (PROTONIX) 40 MG tablet Take 40 mg by mouth  daily.   sertraline (ZOLOFT) 50 MG tablet Take 50 mg by mouth daily.   [DISCONTINUED] mirtazapine (REMERON) 15 MG tablet Take 7.5 mg by mouth at bedtime.   [DISCONTINUED] clotrimazole-betamethasone (LOTRISONE) cream Apply to affected area 2 times daily   [DISCONTINUED] HYDROcodone-acetaminophen (NORCO/VICODIN) 5-325 MG tablet Take 1 tablet by mouth every 6 (six) hours as needed for moderate pain or severe pain.   [DISCONTINUED] miconazole (MICATIN) 2 % cream Apply to affected area 2 times daily   [DISCONTINUED] neomycin-bacitracin-polymyxin (NEOSPORIN) [phone] ointment Apply topically 4 (four) times daily.   [DISCONTINUED] traMADol (ULTRAM) 50 MG tablet Take 2 tablets (100 mg total) by mouth every 6 (six) hours as needed for moderate pain or severe pain.   No facility-administered encounter medications on file as of 11/08/2020.     Thank you for the opportunity to participate in the care of Mr. Martis.  The palliative care team will continue to follow. Please call our office at [phone] if we can be of additional assistance.   Jason Coop, NP , DNP, MPH, AGPCNP-BC, ACHPN  COVID-19 PATIENT SCREENING TOOL Asked and negative response unless otherwise noted:   Have you had symptoms of covid, tested positive or been in contact with someone with symptoms/positive test in the past 5-10 days?

## 2020-12-08 ENCOUNTER — Other Ambulatory Visit: Payer: Self-pay | Admitting: Primary Care

## 2021-05-18 ENCOUNTER — Other Ambulatory Visit: Payer: Self-pay

## 2021-05-18 DIAGNOSIS — L89152 Pressure ulcer of sacral region, stage 2: Secondary | ICD-10-CM | POA: Diagnosis present

## 2021-05-18 DIAGNOSIS — Z955 Presence of coronary angioplasty implant and graft: Secondary | ICD-10-CM

## 2021-05-18 DIAGNOSIS — J189 Pneumonia, unspecified organism: Principal | ICD-10-CM | POA: Diagnosis present

## 2021-05-18 DIAGNOSIS — G9341 Metabolic encephalopathy: Secondary | ICD-10-CM | POA: Diagnosis present

## 2021-05-18 DIAGNOSIS — Z880 Allergy status to penicillin: Secondary | ICD-10-CM

## 2021-05-18 DIAGNOSIS — Z8616 Personal history of COVID-19: Secondary | ICD-10-CM

## 2021-05-18 DIAGNOSIS — K219 Gastro-esophageal reflux disease without esophagitis: Secondary | ICD-10-CM | POA: Diagnosis present

## 2021-05-18 DIAGNOSIS — Z7982 Long term (current) use of aspirin: Secondary | ICD-10-CM

## 2021-05-18 DIAGNOSIS — Z8673 Personal history of transient ischemic attack (TIA), and cerebral infarction without residual deficits: Secondary | ICD-10-CM

## 2021-05-18 DIAGNOSIS — Z83438 Family history of other disorder of lipoprotein metabolism and other lipidemia: Secondary | ICD-10-CM

## 2021-05-18 DIAGNOSIS — Z681 Body mass index (BMI) 19 or less, adult: Secondary | ICD-10-CM

## 2021-05-18 DIAGNOSIS — Z888 Allergy status to other drugs, medicaments and biological substances status: Secondary | ICD-10-CM

## 2021-05-18 DIAGNOSIS — I878 Other specified disorders of veins: Secondary | ICD-10-CM | POA: Diagnosis present

## 2021-05-18 DIAGNOSIS — E43 Unspecified severe protein-calorie malnutrition: Secondary | ICD-10-CM | POA: Diagnosis present

## 2021-05-18 DIAGNOSIS — Z20822 Contact with and (suspected) exposure to covid-19: Secondary | ICD-10-CM | POA: Diagnosis present

## 2021-05-18 DIAGNOSIS — E1122 Type 2 diabetes mellitus with diabetic chronic kidney disease: Secondary | ICD-10-CM | POA: Diagnosis present

## 2021-05-18 DIAGNOSIS — I2510 Atherosclerotic heart disease of native coronary artery without angina pectoris: Secondary | ICD-10-CM | POA: Diagnosis present

## 2021-05-18 DIAGNOSIS — N1831 Chronic kidney disease, stage 3a: Secondary | ICD-10-CM | POA: Diagnosis present

## 2021-05-18 DIAGNOSIS — Z91041 Radiographic dye allergy status: Secondary | ICD-10-CM

## 2021-05-18 DIAGNOSIS — J9621 Acute and chronic respiratory failure with hypoxia: Secondary | ICD-10-CM | POA: Diagnosis present

## 2021-05-18 DIAGNOSIS — R4182 Altered mental status, unspecified: Secondary | ICD-10-CM | POA: Diagnosis not present

## 2021-05-18 DIAGNOSIS — Z9181 History of falling: Secondary | ICD-10-CM

## 2021-05-18 DIAGNOSIS — Z883 Allergy status to other anti-infective agents status: Secondary | ICD-10-CM

## 2021-05-18 DIAGNOSIS — Z87891 Personal history of nicotine dependence: Secondary | ICD-10-CM

## 2021-05-18 DIAGNOSIS — I130 Hypertensive heart and chronic kidney disease with heart failure and stage 1 through stage 4 chronic kidney disease, or unspecified chronic kidney disease: Secondary | ICD-10-CM | POA: Diagnosis present

## 2021-05-18 DIAGNOSIS — Z66 Do not resuscitate: Secondary | ICD-10-CM | POA: Diagnosis present

## 2021-05-18 DIAGNOSIS — R680 Hypothermia, not associated with low environmental temperature: Secondary | ICD-10-CM | POA: Diagnosis present

## 2021-05-18 DIAGNOSIS — Z881 Allergy status to other antibiotic agents status: Secondary | ICD-10-CM

## 2021-05-18 DIAGNOSIS — F0393 Unspecified dementia, unspecified severity, with mood disturbance: Secondary | ICD-10-CM | POA: Diagnosis present

## 2021-05-18 DIAGNOSIS — J8410 Pulmonary fibrosis, unspecified: Secondary | ICD-10-CM | POA: Diagnosis present

## 2021-05-18 DIAGNOSIS — Y92009 Unspecified place in unspecified non-institutional (private) residence as the place of occurrence of the external cause: Secondary | ICD-10-CM

## 2021-05-18 DIAGNOSIS — Z79899 Other long term (current) drug therapy: Secondary | ICD-10-CM

## 2021-05-18 DIAGNOSIS — W19XXXA Unspecified fall, initial encounter: Secondary | ICD-10-CM | POA: Diagnosis present

## 2021-05-18 DIAGNOSIS — E860 Dehydration: Secondary | ICD-10-CM | POA: Diagnosis present

## 2021-05-18 DIAGNOSIS — F0394 Unspecified dementia, unspecified severity, with anxiety: Secondary | ICD-10-CM | POA: Diagnosis present

## 2021-05-18 DIAGNOSIS — E785 Hyperlipidemia, unspecified: Secondary | ICD-10-CM | POA: Diagnosis present

## 2021-05-18 DIAGNOSIS — E11649 Type 2 diabetes mellitus with hypoglycemia without coma: Secondary | ICD-10-CM | POA: Diagnosis not present

## 2021-05-18 DIAGNOSIS — I5031 Acute diastolic (congestive) heart failure: Secondary | ICD-10-CM | POA: Diagnosis present

## 2021-05-18 DIAGNOSIS — E872 Acidosis, unspecified: Secondary | ICD-10-CM | POA: Diagnosis present

## 2021-05-18 NOTE — ED Triage Notes (Signed)
Pt to ED via [facility] from home.  EMS states that pt has dementia and  has had 3-4 days of increasing weakness, falls, decreased PO intake, more confusion and a foul odor coming from urine and sacral wound per family.  Pt alert, NAD noted, pt A&Ox4 on arrival to triage. Pt c/o pain in buttocks.

## 2021-05-19 ENCOUNTER — Emergency Department: Payer: No Typology Code available for payment source

## 2021-05-19 ENCOUNTER — Other Ambulatory Visit: Payer: Self-pay

## 2021-05-19 ENCOUNTER — Inpatient Hospital Stay
Admission: EM | Admit: 2021-05-19 | Discharge: 2021-05-26 | DRG: 193 | Disposition: A | Discharge status: Skilled Nursing Facility | Payer: No Typology Code available for payment source | Attending: Internal Medicine | Admitting: Internal Medicine

## 2021-05-19 DIAGNOSIS — I10 Essential (primary) hypertension: Secondary | ICD-10-CM | POA: Diagnosis not present

## 2021-05-19 DIAGNOSIS — N1831 Chronic kidney disease, stage 3a: Secondary | ICD-10-CM

## 2021-05-19 DIAGNOSIS — G9341 Metabolic encephalopathy: Secondary | ICD-10-CM | POA: Diagnosis present

## 2021-05-19 DIAGNOSIS — Z681 Body mass index (BMI) 19 or less, adult: Secondary | ICD-10-CM | POA: Diagnosis not present

## 2021-05-19 DIAGNOSIS — E785 Hyperlipidemia, unspecified: Secondary | ICD-10-CM | POA: Diagnosis present

## 2021-05-19 DIAGNOSIS — J189 Pneumonia, unspecified organism: Secondary | ICD-10-CM | POA: Diagnosis present

## 2021-05-19 DIAGNOSIS — E43 Unspecified severe protein-calorie malnutrition: Secondary | ICD-10-CM | POA: Diagnosis present

## 2021-05-19 DIAGNOSIS — R0902 Hypoxemia: Secondary | ICD-10-CM

## 2021-05-19 DIAGNOSIS — J8410 Pulmonary fibrosis, unspecified: Secondary | ICD-10-CM | POA: Diagnosis present

## 2021-05-19 DIAGNOSIS — F0393 Unspecified dementia, unspecified severity, with mood disturbance: Secondary | ICD-10-CM | POA: Diagnosis present

## 2021-05-19 DIAGNOSIS — R531 Weakness: Secondary | ICD-10-CM | POA: Insufficient documentation

## 2021-05-19 DIAGNOSIS — W19XXXA Unspecified fall, initial encounter: Secondary | ICD-10-CM | POA: Diagnosis present

## 2021-05-19 DIAGNOSIS — Z66 Do not resuscitate: Secondary | ICD-10-CM | POA: Diagnosis present

## 2021-05-19 DIAGNOSIS — R4182 Altered mental status, unspecified: Secondary | ICD-10-CM | POA: Diagnosis present

## 2021-05-19 DIAGNOSIS — R6510 Systemic inflammatory response syndrome (SIRS) of non-infectious origin without acute organ dysfunction: Secondary | ICD-10-CM | POA: Diagnosis present

## 2021-05-19 DIAGNOSIS — I2510 Atherosclerotic heart disease of native coronary artery without angina pectoris: Secondary | ICD-10-CM | POA: Diagnosis present

## 2021-05-19 DIAGNOSIS — F0394 Unspecified dementia, unspecified severity, with anxiety: Secondary | ICD-10-CM | POA: Diagnosis present

## 2021-05-19 DIAGNOSIS — G459 Transient cerebral ischemic attack, unspecified: Secondary | ICD-10-CM | POA: Diagnosis not present

## 2021-05-19 DIAGNOSIS — R410 Disorientation, unspecified: Secondary | ICD-10-CM

## 2021-05-19 DIAGNOSIS — Z8616 Personal history of COVID-19: Secondary | ICD-10-CM | POA: Diagnosis not present

## 2021-05-19 DIAGNOSIS — I130 Hypertensive heart and chronic kidney disease with heart failure and stage 1 through stage 4 chronic kidney disease, or unspecified chronic kidney disease: Secondary | ICD-10-CM | POA: Diagnosis present

## 2021-05-19 DIAGNOSIS — N183 Chronic kidney disease, stage 3 unspecified: Secondary | ICD-10-CM | POA: Diagnosis present

## 2021-05-19 DIAGNOSIS — F418 Other specified anxiety disorders: Secondary | ICD-10-CM | POA: Diagnosis not present

## 2021-05-19 DIAGNOSIS — E1122 Type 2 diabetes mellitus with diabetic chronic kidney disease: Secondary | ICD-10-CM | POA: Diagnosis present

## 2021-05-19 DIAGNOSIS — L98429 Non-pressure chronic ulcer of back with unspecified severity: Secondary | ICD-10-CM | POA: Diagnosis not present

## 2021-05-19 DIAGNOSIS — A419 Sepsis, unspecified organism: Secondary | ICD-10-CM

## 2021-05-19 DIAGNOSIS — R296 Repeated falls: Secondary | ICD-10-CM

## 2021-05-19 DIAGNOSIS — Z9181 History of falling: Secondary | ICD-10-CM | POA: Diagnosis not present

## 2021-05-19 DIAGNOSIS — Y92009 Unspecified place in unspecified non-institutional (private) residence as the place of occurrence of the external cause: Secondary | ICD-10-CM

## 2021-05-19 DIAGNOSIS — N179 Acute kidney failure, unspecified: Secondary | ICD-10-CM

## 2021-05-19 DIAGNOSIS — J9621 Acute and chronic respiratory failure with hypoxia: Secondary | ICD-10-CM | POA: Diagnosis present

## 2021-05-19 DIAGNOSIS — E860 Dehydration: Secondary | ICD-10-CM | POA: Diagnosis present

## 2021-05-19 DIAGNOSIS — E1129 Type 2 diabetes mellitus with other diabetic kidney complication: Secondary | ICD-10-CM | POA: Diagnosis present

## 2021-05-19 DIAGNOSIS — Z20822 Contact with and (suspected) exposure to covid-19: Secondary | ICD-10-CM | POA: Diagnosis present

## 2021-05-19 DIAGNOSIS — L89152 Pressure ulcer of sacral region, stage 2: Secondary | ICD-10-CM | POA: Diagnosis present

## 2021-05-19 DIAGNOSIS — E872 Acidosis, unspecified: Secondary | ICD-10-CM | POA: Diagnosis present

## 2021-05-19 DIAGNOSIS — E11649 Type 2 diabetes mellitus with hypoglycemia without coma: Secondary | ICD-10-CM | POA: Diagnosis not present

## 2021-05-19 DIAGNOSIS — Z8673 Personal history of transient ischemic attack (TIA), and cerebral infarction without residual deficits: Secondary | ICD-10-CM | POA: Diagnosis not present

## 2021-05-19 DIAGNOSIS — R7989 Other specified abnormal findings of blood chemistry: Secondary | ICD-10-CM

## 2021-05-19 DIAGNOSIS — I5031 Acute diastolic (congestive) heart failure: Secondary | ICD-10-CM | POA: Diagnosis present

## 2021-05-19 DIAGNOSIS — I878 Other specified disorders of veins: Secondary | ICD-10-CM | POA: Diagnosis present

## 2021-05-19 LAB — LIPASE, BLOOD: Lipase: 52 U/L — ABNORMAL HIGH (ref 11–51)

## 2021-05-19 LAB — COMPREHENSIVE METABOLIC PANEL
ALT: 18 U/L (ref 0–44)
AST: 35 U/L (ref 15–41)
Albumin: 3.5 g/dL (ref 3.5–5.0)
Alkaline Phosphatase: 94 U/L (ref 38–126)
Anion gap: 8 (ref 5–15)
BUN: 23 mg/dL (ref 8–23)
CO2: 24 mmol/L (ref 22–32)
Calcium: 8.7 mg/dL — ABNORMAL LOW (ref 8.9–10.3)
Chloride: 105 mmol/L (ref 98–111)
Creatinine, Ser: 1.29 mg/dL — ABNORMAL HIGH (ref 0.61–1.24)
GFR, Estimated: 55 mL/min — ABNORMAL LOW (ref >60)
Glucose, Bld: 198 mg/dL — ABNORMAL HIGH (ref 70–99)
Potassium: 4.1 mmol/L (ref 3.5–5.1)
Sodium: 137 mmol/L (ref 135–145)
Total Bilirubin: 0.9 mg/dL (ref 0.3–1.2)
Total Protein: 6.7 g/dL (ref 6.5–8.1)

## 2021-05-19 LAB — LACTIC ACID, PLASMA
Lactic Acid, Venous: 3.1 mmol/L (ref 0.5–1.9)
Lactic Acid, Venous: 1 mmol/L (ref 0.5–1.9)

## 2021-05-19 LAB — URINALYSIS, ROUTINE W REFLEX MICROSCOPIC
Bacteria, UA: NONE SEEN
Bilirubin Urine: NEGATIVE
Glucose, UA: NEGATIVE mg/dL
Hgb urine dipstick: NEGATIVE
Ketones, ur: NEGATIVE mg/dL
Leukocytes,Ua: NEGATIVE
Nitrite: NEGATIVE
Protein, ur: 30 mg/dL — AB
Specific Gravity, Urine: 1.031 — ABNORMAL HIGH (ref 1.005–1.030)
Squamous Epithelial / HPF: NONE SEEN (ref 0–5)
pH: 5 (ref 5.0–8.0)

## 2021-05-19 LAB — CBC WITH DIFFERENTIAL/PLATELET
Abs Immature Granulocytes: 0.03 10*3/uL (ref 0.00–0.07)
Basophils Absolute: 0 10*3/uL (ref 0.0–0.1)
Basophils Relative: 0 %
Eosinophils Absolute: 0.1 10*3/uL (ref 0.0–0.5)
Eosinophils Relative: 1 %
HCT: 44 % (ref 39.0–52.0)
Hemoglobin: 14.4 g/dL (ref 13.0–17.0)
Immature Granulocytes: 0 %
Lymphocytes Relative: 4 %
Lymphs Abs: 0.3 10*3/uL — ABNORMAL LOW (ref 0.7–4.0)
MCH: 31.2 pg (ref 26.0–34.0)
MCHC: 32.7 g/dL (ref 30.0–36.0)
MCV: 95.4 fL (ref 80.0–100.0)
Monocytes Absolute: 0.6 10*3/uL (ref 0.1–1.0)
Monocytes Relative: 6 %
Neutro Abs: 8.3 10*3/uL — ABNORMAL HIGH (ref 1.7–7.7)
Neutrophils Relative %: 89 %
Platelets: 158 10*3/uL (ref 150–400)
RBC: 4.61 MIL/uL (ref 4.22–5.81)
RDW: 14.4 % (ref 11.5–15.5)
WBC: 9.4 10*3/uL (ref 4.0–10.5)
nRBC: 0 % (ref 0.0–0.2)

## 2021-05-19 LAB — RESP PANEL BY RT-PCR (FLU A&B, COVID) ARPGX2
Influenza A by PCR: NEGATIVE
Influenza B by PCR: NEGATIVE
SARS Coronavirus 2 by RT PCR: NEGATIVE

## 2021-05-19 LAB — PROTIME-INR
INR: 1 (ref 0.8–1.2)
Prothrombin Time: 13.6 seconds (ref 11.4–15.2)

## 2021-05-19 LAB — PROCALCITONIN: Procalcitonin: <0.1 ng/mL

## 2021-05-19 LAB — APTT: aPTT: 30 seconds (ref 24–36)

## 2021-05-19 MED ORDER — ALBUTEROL SULFATE (2.5 MG/3ML) 0.083% IN NEBU: 2.5000 mg | INHALATION_SOLUTION | RESPIRATORY_TRACT | Status: DC | PRN

## 2021-05-19 MED ORDER — HYDROXYZINE HCL 25 MG PO TABS
25.0000 mg | ORAL_TABLET | Freq: Two times a day (BID) | ORAL | Status: DC | PRN
  Filled 2021-05-19: qty 1

## 2021-05-19 MED ORDER — IOHEXOL 300 MG/ML  SOLN
75.0000 mL | Freq: Once | INTRAMUSCULAR | Status: AC | PRN
  Administered 2021-05-19: 03:00:00 75 mL via INTRAVENOUS

## 2021-05-19 MED ORDER — ASPIRIN 81 MG PO CHEW
81.0000 mg | CHEWABLE_TABLET | Freq: Every day | ORAL | Status: DC
  Administered 2021-05-19 – 2021-05-26 (×8): 81 mg via ORAL
  Filled 2021-05-19 (×8): qty 1

## 2021-05-19 MED ORDER — LACTATED RINGERS IV SOLN: INTRAVENOUS | Status: AC

## 2021-05-19 MED ORDER — ENOXAPARIN SODIUM 40 MG/0.4ML IJ SOSY
40.0000 mg | PREFILLED_SYRINGE | INTRAMUSCULAR | Status: DC
  Administered 2021-05-19: 23:00:00 40 mg via SUBCUTANEOUS
  Filled 2021-05-19 (×2): qty 0.4

## 2021-05-19 MED ORDER — LACTATED RINGERS IV BOLUS (SEPSIS)
1000.0000 mL | Freq: Once | INTRAVENOUS | Status: AC
  Administered 2021-05-19: 03:00:00 1000 mL via INTRAVENOUS

## 2021-05-19 MED ORDER — METRONIDAZOLE 500 MG/100ML IV SOLN
500.0000 mg | Freq: Once | INTRAVENOUS | Status: AC
  Administered 2021-05-19: 04:00:00 500 mg via INTRAVENOUS
  Filled 2021-05-19: qty 100

## 2021-05-19 MED ORDER — MIRTAZAPINE 15 MG PO TABS
15.0000 mg | ORAL_TABLET | Freq: Every day | ORAL | Status: DC
  Administered 2021-05-19 – 2021-05-25 (×7): 15 mg via ORAL
  Filled 2021-05-19 (×7): qty 1

## 2021-05-19 MED ORDER — ACETAMINOPHEN 325 MG PO TABS
650.0000 mg | ORAL_TABLET | Freq: Four times a day (QID) | ORAL | Status: DC | PRN
  Administered 2021-05-24 – 2021-05-25 (×2): 650 mg via ORAL
  Filled 2021-05-19 (×2): qty 2

## 2021-05-19 MED ORDER — PANTOPRAZOLE SODIUM 40 MG PO TBEC
40.0000 mg | DELAYED_RELEASE_TABLET | Freq: Every day | ORAL | Status: DC
  Administered 2021-05-19 – 2021-05-26 (×8): 40 mg via ORAL
  Filled 2021-05-19 (×8): qty 1

## 2021-05-19 MED ORDER — VANCOMYCIN HCL IN DEXTROSE 1-5 GM/200ML-% IV SOLN
1000.0000 mg | Freq: Once | INTRAVENOUS | Status: AC
  Administered 2021-05-19: 06:00:00 1000 mg via INTRAVENOUS
  Filled 2021-05-19: qty 200

## 2021-05-19 MED ORDER — LACTATED RINGERS IV BOLUS (SEPSIS)
250.0000 mL | Freq: Once | INTRAVENOUS | Status: AC
  Administered 2021-05-19: 04:00:00 250 mL via INTRAVENOUS

## 2021-05-19 MED ORDER — HYDRALAZINE HCL 20 MG/ML IJ SOLN: 5.0000 mg | INTRAMUSCULAR | Status: DC | PRN

## 2021-05-19 MED ORDER — LACTATED RINGERS IV BOLUS (SEPSIS)
500.0000 mL | Freq: Once | INTRAVENOUS | Status: AC
  Administered 2021-05-19: 04:00:00 500 mL via INTRAVENOUS

## 2021-05-19 MED ORDER — HYDROCERIN EX CREA
TOPICAL_CREAM | Freq: Every day | CUTANEOUS | Status: DC
  Filled 2021-05-19 (×2): qty 113

## 2021-05-19 MED ORDER — FENTANYL CITRATE PF 50 MCG/ML IJ SOSY
50.0000 ug | PREFILLED_SYRINGE | Freq: Once | INTRAMUSCULAR | Status: AC
  Administered 2021-05-19: 03:00:00 50 ug via INTRAVENOUS
  Filled 2021-05-19: qty 1

## 2021-05-19 MED ORDER — SERTRALINE HCL 50 MG PO TABS
50.0000 mg | ORAL_TABLET | Freq: Every day | ORAL | Status: DC
  Administered 2021-05-19 – 2021-05-26 (×8): 50 mg via ORAL
  Filled 2021-05-19 (×8): qty 1

## 2021-05-19 MED ORDER — SODIUM CHLORIDE 0.9 % IV SOLN
2.0000 g | Freq: Once | INTRAVENOUS | Status: AC
  Administered 2021-05-19: 03:00:00 2 g via INTRAVENOUS
  Filled 2021-05-19: qty 2

## 2021-05-19 MED ORDER — EZETIMIBE 10 MG PO TABS
10.0000 mg | ORAL_TABLET | Freq: Every day | ORAL | Status: DC
  Administered 2021-05-20 – 2021-05-26 (×7): 10 mg via ORAL
  Filled 2021-05-19 (×8): qty 1

## 2021-05-19 MED ORDER — ONDANSETRON HCL 4 MG/2ML IJ SOLN
4.0000 mg | Freq: Once | INTRAMUSCULAR | Status: AC
  Administered 2021-05-19: 03:00:00 4 mg via INTRAVENOUS
  Filled 2021-05-19: qty 2

## 2021-05-19 MED ORDER — OXYCODONE-ACETAMINOPHEN 5-325 MG PO TABS
1.0000 | ORAL_TABLET | ORAL | Status: DC | PRN
  Administered 2021-05-19 – 2021-05-26 (×7): 1 via ORAL
  Filled 2021-05-19 (×7): qty 1

## 2021-05-19 MED ORDER — DM-GUAIFENESIN ER 30-600 MG PO TB12
1.0000 | ORAL_TABLET | Freq: Two times a day (BID) | ORAL | Status: DC | PRN
  Administered 2021-05-21 – 2021-05-23 (×3): 1 via ORAL
  Filled 2021-05-19 (×4): qty 1

## 2021-05-19 NOTE — ED Provider Notes (Signed)
[facility] Emergency Department Provider Note ____________________________________________   Event Date/Time   First MD Initiated Contact with Patient 05/19/21 [phone]     (approximate)  I have reviewed the triage vital signs and the nursing notes.   HISTORY  Chief Complaint Weakness    HPI TAB Frank Khan is a 86 y.o. male history of hypertension, diabetes, pulmonary fibrosis, dementia who presents to the emergency department with his son for concerns for generalized weakness, falls today.  Son reports he fell out of his chair onto his bottom.  He has been complaining of pain around his bottom and son reports he has a sacral decubitus ulcer.  Also reports he has had foul-smelling urine.  No known fevers.  Patient denies chest pain, shortness of breath or cough.  Has had some abdominal pain.  No vomiting or diarrhea.  Denies hitting his head with any of his falls but family reports more confusion.  He is on aspirin but no anticoagulants.  Family report progressive decline over the past couple of days.  They report that he lives at home with his granddaughter.  He uses a walker at baseline.  He wears oxygen chronically for his pulmonary fibrosis.         Past Medical History:  Diagnosis Date   COVID-19 08/01/2019   Diagnosed on 08/01/2019   Dementia ([place])    Diabetes mellitus without complication (HCC)    Hypertension    Pulmonary fibrosis (HCC)    TIA (transient ischemic attack)     Patient Active Problem List   Diagnosis Date Noted   Acute cholecystitis due to biliary calculus 11/08/2019   TIA (transient ischemic attack)    Pulmonary fibrosis ([place])    Hypertension    Diabetes mellitus without complication ([place])    Difficulty speaking    Hypoglycemia    COVID-19    CKD (chronic kidney disease), stage III (HCC)    HLD (hyperlipidemia)    GERD (gastroesophageal reflux disease)    Depression    CAD (coronary artery disease)     Past Surgical  History:  Procedure Laterality Date   CORONARY ANGIOPLASTY WITH STENT PLACEMENT      Prior to Admission medications   Medication Sig Start Date End Date Taking? Authorizing Provider  aspirin 81 MG chewable tablet Chew 81 mg by mouth daily.   Yes [provider]  ezetimibe (ZETIA) 10 MG tablet Take 10 mg by mouth daily.   Yes [provider]  hydrOXYzine (ATARAX) 25 MG tablet Take 25 mg by mouth 2 (two) times daily as needed for itching.   Yes [provider]  ibuprofen (ADVIL) 800 MG tablet Take 1 tablet (800 mg total) by mouth every 8 (eight) hours as needed. 11/09/19  Yes Edison Simon R, PA-C  mirtazapine (REMERON) 7.5 MG tablet Take 15 mg by mouth at bedtime. 01/20/21  Yes [provider]  pantoprazole (PROTONIX) 40 MG tablet Take 40 mg by mouth daily.   Yes [provider]  sertraline (ZOLOFT) 50 MG tablet Take 50 mg by mouth daily.   Yes [provider]  cetirizine (ZYRTEC) 10 MG tablet Take 10 mg by mouth daily. Patient not taking: Reported on 05/19/2021    [provider]    Allergies Doxycycline, Penicillins, Simvastatin, Chlorhexidine, and Iodine  Family History  Problem Relation Age of Onset   Hyperlipidemia Sister     Social History Social History   Tobacco Use   Smoking status: Former  Smokeless tobacco: Never  Vaping Use   Vaping Use: Never used  Substance Use Topics   Alcohol use: No   Drug use: No    Review of Systems Level 5 caveat secondary to dementia  ____________________________________________   PHYSICAL EXAM:  VITAL SIGNS: ED Triage Vitals  Enc Vitals Group     BP 05/18/21 2353 109/65     Pulse Rate 05/18/21 2353 (!) 104     Resp 05/18/21 2353 20     Temp 05/18/21 2353 (!) 96.7 F (35.9 C)     Temp Source 05/18/21 2353 Axillary     SpO2 05/18/21 2353 92 %     Weight 05/18/21 2354 120 lb (54.4 kg)     Height 05/18/21 2354 6' (1.829 m)     Head Circumference --      Peak  Flow --      Pain Score --      Pain Loc --      Pain Edu? --      Excl. in [place]? --    CONSTITUTIONAL: Alert, elderly, thin HEAD: Normocephalic; atraumatic EYES: Conjunctivae clear, PERRL, EOMI ENT: normal nose; no rhinorrhea; moist mucous membranes; pharynx without lesions noted; no dental injury; no septal hematoma NECK: Supple, no meningismus, no LAD; no midline spinal tenderness, step-off or deformity; trachea midline CARD: regular and minimally tachycardic; S1 and S2 appreciated; no murmurs, no clicks, no rubs, no gallops RESP: Patient has slightly tachypneic, worse with any exertion, slightly rhonchorous breath sounds diffusely, no wheezing, no respiratory distress, speaking full sentences, no hypoxia on nasal cannula CHEST:  chest wall stable, no crepitus or ecchymosis or deformity, nontender to palpation; no flail chest ABD/GI: Normal bowel sounds; non-distended; soft, diffusely tender throughout the abdomen, no rebound, no guarding; no ecchymosis or other lesions noted; patient has some redness noted to the sacrum with tenderness but no open wounds, fluctuance, induration, drainage; rectal temp is 96.3.  Stool is normal brown in appearance without blood or melena PELVIS:  stable, nontender to palpation BACK:  The back appears normal and is non-tender to palpation, there is no CVA tenderness; no midline spinal tenderness, step-off or deformity EXT: Normal ROM in all joints; non-tender to palpation; no edema; normal capillary refill; no cyanosis, no bony tenderness or bony deformity of patient's extremities, no joint effusion, compartments are soft, extremities are warm and well-perfused, no ecchymosis SKIN: Normal color for age and race; warm NEURO: Moves all extremities equally, normal speech, no facial asymmetry PSYCH: The patient's mood and manner are appropriate. Grooming and personal hygiene are appropriate.  ____________________________________________   LABS (all labs ordered  are listed, but only abnormal results are displayed)  Labs Reviewed  COMPREHENSIVE METABOLIC PANEL - Abnormal; Notable for the following components:      Result Value   Glucose, Bld 198 (*)    Creatinine, Ser 1.29 (*)    Calcium 8.7 (*)    GFR, Estimated 55 (*)    All other components within normal limits  LACTIC ACID, PLASMA - Abnormal; Notable for the following components:   Lactic Acid, Venous 3.1 (*)    All other components within normal limits  CBC WITH DIFFERENTIAL/PLATELET - Abnormal; Notable for the following components:   Neutro Abs 8.3 (*)    Lymphs Abs 0.3 (*)    All other components within normal limits  URINALYSIS, ROUTINE W REFLEX MICROSCOPIC - Abnormal; Notable for the following components:   Color, Urine YELLOW (*)    APPearance HAZY (*)  Specific Gravity, Urine 1.031 (*)    Protein, ur 30 (*)    All other components within normal limits  LIPASE, BLOOD - Abnormal; Notable for the following components:   Lipase 52 (*)    All other components within normal limits  RESP PANEL BY RT-PCR (FLU A&B, COVID) ARPGX2  CULTURE, BLOOD (ROUTINE X 2)  CULTURE, BLOOD (ROUTINE X 2)  URINE CULTURE  PROTIME-INR  PROCALCITONIN  LACTIC ACID, PLASMA   ____________________________________________  EKG   EKG Interpretation  Date/Time:  Friday May 19 2021 00:40:13 EST Ventricular Rate:  99 PR Interval:  142 QRS Duration: 122 QT Interval:  398 QTC Calculation: 510 R Axis:   222 Text Interpretation: Normal sinus rhythm Right bundle branch block , plus right ventricular hypertrophy T wave abnormality, consider inferolateral ischemia Abnormal ECG When compared with ECG of 08-Nov-2019 06:23, PREVIOUS ECG IS PRESENT No significant change since last tracing Confirmed by Rochele Raring [phone]) on 05/19/2021 2:45:43 AM        ____________________________________________  RADIOLOGY Normajean Baxter Saquan Furtick, personally viewed and evaluated these images (plain radiographs) as part of  my medical decision making, as well as reviewing the written report by the radiologist.  ED MD interpretation: Injury.  CT of the abdomen pelvis shows no acute findings.  Chest x-ray shows pulmonary fibrosis without infiltrate, edema or pneumothorax.  Official radiology report(s): CT HEAD WO CONTRAST ( )  Result Date: 05/19/2021 CLINICAL DATA:  Increasing weakness and falls. EXAM: CT HEAD WITHOUT CONTRAST TECHNIQUE: Contiguous axial images were obtained from the base of the skull through the vertex without intravenous contrast. COMPARISON:  January 10, 2020 FINDINGS: Brain: There is moderate severity cerebral atrophy with widening of the extra-axial spaces and ventricular dilatation. There are areas of decreased attenuation within the white matter tracts of the supratentorial brain, consistent with microvascular disease changes. A chronic right occipital lobe infarct is noted. A punctate calcification is again seen within the central pons. Vascular: No hyperdense vessel or unexpected calcification. Skull: Normal. Negative for fracture or focal lesion. Sinuses/Orbits: No acute finding. Other: None. IMPRESSION: 1. Generalized cerebral atrophy. 2. Chronic right occipital lobe infarct. 3. No acute intracranial abnormality. Electronically Signed   By: Aram Candela M.D.   On: 05/19/2021 03:06   CT Cervical Spine Wo Contrast  Result Date: 05/19/2021 CLINICAL DATA:  Increasing weakness and falls. EXAM: CT CERVICAL SPINE WITHOUT CONTRAST TECHNIQUE: Multidetector CT imaging of the cervical spine was performed without intravenous contrast. Multiplanar CT image reconstructions were also generated. COMPARISON:  August 02, 2019 FINDINGS: Alignment: Stable, approximately 1 mm retrolisthesis of the C4 vertebral body is noted on C5. Skull base and vertebrae: No acute fracture. No primary bone lesion or focal pathologic process. Soft tissues and spinal canal: No prevertebral fluid or swelling. No visible canal  hematoma. Disc levels: Mild to moderate severity endplate sclerosis is seen at the level of C3-C4 with moderate to marked severity endplate sclerosis seen at the levels of C4-C5 and C5-C6. There is marked severity narrowing of the anterior atlantoaxial articulation. Mild intervertebral disc space narrowing is seen at the level of C2-C3. Marked severity intervertebral disc space narrowing is seen at the levels of C4-C5 and C5-C6 and along the posterior aspect of the C3-C4 level. Bilateral moderate to marked severity multilevel facet joint hypertrophy is noted. Upper chest: There is mild biapical scarring and/or atelectasis. Other: None. IMPRESSION: 1. Marked severity multilevel degenerative changes, most prominent at the levels of C4-C5 and C5-C6. 2. No evidence of an acute  cervical spine fracture. Electronically Signed   By: Virgina Norfolk M.D.   On: 05/19/2021 03:13   CT ABDOMEN PELVIS W CONTRAST  Result Date: 05/19/2021 CLINICAL DATA:  Weakness and falls. EXAM: CT ABDOMEN AND PELVIS WITH CONTRAST TECHNIQUE: Multidetector CT imaging of the abdomen and pelvis was performed using the standard protocol following bolus administration of intravenous contrast. CONTRAST:  5mL OMNIPAQUE IOHEXOL 300 MG/ML  SOLN COMPARISON:  November 08, 2019 FINDINGS: Lower chest: Changes consistent with pulmonary fibrosis are again seen within the bilateral lung bases. A very small right pleural effusion is seen. Hepatobiliary: No focal liver abnormality is seen. The gallbladder is not clearly identified. Pancreas: Unremarkable. No pancreatic ductal dilatation or surrounding inflammatory changes. Spleen: Normal in size without focal abnormality. Adrenals/Urinary Tract: Adrenal glands are unremarkable. The right kidney is normal in size. Atrophic changes and cortical thinning are seen along the anterior and lateral aspects of the mid to lower left kidney. 2 mm nonobstructing renal calculi are seen within the posterior aspect of the mid  right kidney and anterolateral aspect of the mid to upper left kidney. Bladder is unremarkable. Stomach/Bowel: Stomach is within normal limits. The appendix is not visualized. No evidence of bowel dilatation. Stool is seen throughout the colon. Noninflamed diverticula are seen throughout the large bowel. Vascular/Lymphatic: Marked severity aortic atherosclerosis. No enlarged abdominal or pelvic lymph nodes. Reproductive: The prostate gland is mildly enlarged. Other: No abdominal wall hernia or abnormality. No abdominopelvic ascites. Musculoskeletal: Multilevel degenerative changes seen throughout the lumbar spine. IMPRESSION: 1. Findings consistent with bibasilar pulmonary fibrosis. 2. Very small right pleural effusion. 3. Bilateral nonobstructing renal calculi. 4. Atrophic changes and cortical thinning of the mid to lower left kidney. 5. Colonic diverticulosis. 6. Marked severity aortic atherosclerosis. Aortic Atherosclerosis (ICD10-I70.0). Electronically Signed   By: Virgina Norfolk M.D.   On: 05/19/2021 03:21   DG Chest Port 1 View  Result Date: 05/19/2021 CLINICAL DATA:  Increasing weakness, multiple falls, confusion EXAM: PORTABLE CHEST 1 VIEW COMPARISON:  08/02/2019 FINDINGS: Single frontal view of the chest demonstrates an unremarkable cardiac silhouette. Stable bilateral parenchymal scarring and fibrosis, greatest at the bases. No acute airspace disease, effusion, or pneumothorax. No acute bony abnormalities. IMPRESSION: 1. Bilateral parenchymal lung scarring and fibrosis. No acute airspace disease. Electronically Signed   By: Randa Ngo M.D.   On: 05/19/2021 00:25    ____________________________________________   PROCEDURES  Procedure(s) performed (including Critical Care):  Procedures  CRITICAL CARE Performed by: Cyril Mourning Natina Wiginton   Total critical care time: 65 Minutes  Critical care time was exclusive of separately billable procedures and treating other patients.  Critical care  was necessary to treat or prevent imminent or life-threatening deterioration.  Critical care was time spent personally by me on the following activities: development of treatment plan with patient and/or surrogate as well as nursing, discussions with consultants, evaluation of patient's response to treatment, examination of patient, obtaining history from patient or surrogate, ordering and performing treatments and interventions, ordering and review of laboratory studies, ordering and review of radiographic studies, pulse oximetry and re-evaluation of patient's condition.  ____________________________________________   INITIAL IMPRESSION / ASSESSMENT AND PLAN / ED COURSE  As part of my medical decision making, I reviewed the following data within the [place] History obtained from family, Nursing notes reviewed and incorporated, Labs reviewed , EKG interpreted , Old EKG reviewed, Old chart reviewed, Radiograph reviewed , Discussed with admitting physician , CTs reviewed, and Notes from prior ED visits  Patient here with generalized weakness, multiple falls, confusion, concerns for possible infected sacral decubitus ulcer versus UTI.  Differential includes intracranial hemorrhage, CVA, metabolic encephalopathy, infectious etiology such as UTI, pneumonia, COVID-19, infected wound, bacteremia, sepsis.  Will obtain labs, urine, cultures, COVID and flu swabs.  We will also obtain a CT of his head due to his fall and worsening confusion.  He has no obvious sign of focal neurologic deficit on exam but exam, history limited due to dementia.  Also seems to be tender throughout the abdomen.  Differential includes colitis, diverticulitis, appendicitis, UTI, kidney stone, pyelonephritis, bowel obstruction, gastritis, pancreatitis, cholecystitis, cholangitis.  Will obtain CT of the abdomen pelvis.  Will give IV fluids, broad-spectrum antibiotics and pain and nausea medicine.  Anticipate  admission.  ED PROGRESS  Patient has no leukocytosis or leukopenia but does have a lactic of 3.1.  He is slightly tachycardic and had an axillary temperature of 35.9.  Will obtain rectal temperature.   Patient has a stage I sacral decubitus wound but no signs of superimposed infection.  Rectal temperature here is low at 96.3.  We will place him under a [employer].  Family seems mostly concerned about a UTI given he has had a foul-smelling urine and has complained of burning with urination.    4:22 AM  Urine does not appear infected but culture is pending given he is having symptoms.  Repeat lactic pending.  COVID and flu negative.  CT head and cervical spine show no acute traumatic injury.  CT of the abdomen pelvis unremarkable.  Given intermittent tachycardia, tachypnea and hypothermia, patient does meet SIRS criteria and received broad-spectrum antibiotics and 30 mL/kg IV fluid bolus.  No source of infection at this time.  Procalcitonin is normal.  Will admit for generalized weakness, confusion, SIRS.   4:46 AM  Discussed patient's case with hospitalist, Dr. Hal Hope.  I have recommended admission and patient (and family if present) agree with this plan. Admitting physician will place admission orders.   I reviewed all nursing notes, vitals, pertinent previous records and reviewed/interpreted all EKGs, lab and urine results, imaging (as available).  ____________________________________________   FINAL CLINICAL IMPRESSION(S) / ED DIAGNOSES  Final diagnoses:  Generalized weakness  Falls frequently  Confusion  Elevated lactic acid level  SIRS (systemic inflammatory response syndrome) [facility])     ED Discharge Orders     None       *Please note:  Frank Khan was evaluated in Emergency Department on 05/19/2021 for the symptoms described in the history of present illness. He was evaluated in the context of the global COVID-19 pandemic, which necessitated consideration that the  patient might be at risk for infection with the SARS-CoV-2 virus that causes COVID-19. Institutional protocols and algorithms that pertain to the evaluation of patients at risk for COVID-19 are in a state of rapid change based on information released by regulatory bodies including the CDC and federal and state organizations. These policies and algorithms were followed during the patient's care in the ED.  Some ED evaluations and interventions may be delayed as a result of limited staffing during and the pandemic.*   Note:  This document was prepared using Dragon voice recognition software and may include unintentional dictation errors.    Erle Guster, Delice Bison, DO 05/19/21 [phone]

## 2021-05-19 NOTE — Progress Notes (Signed)
CODE SEPSIS - PHARMACY COMMUNICATION  **Broad Spectrum Antibiotics should be administered within 1 hour of Sepsis diagnosis**  Time Code Sepsis Called/Page Received: 0233  Antibiotics Ordered: Cefepime, Vancomycin, Flagyl  Time of 1st antibiotic administration: 0259  Otelia Sergeant, PharmD, MBA 05/19/2021 2:30 AM

## 2021-05-19 NOTE — ED Notes (Signed)
Pt. Sleeping, chest rise and fall.

## 2021-05-19 NOTE — Progress Notes (Signed)
PHARMACY -  BRIEF ANTIBIOTIC NOTE   Pharmacy has received consult(s) for Cefepime and Vancomycin from an ED provider.  The patient's profile has been reviewed for ht/wt/allergies/indication/available labs.    One time order(s) placed for Cefepime 2 gm & Vancomycin 1 gm per pt wt: 54.4 kg  Further antibiotics/pharmacy consults should be ordered by admitting physician if indicated.                       Thank you, Otelia Sergeant, PharmD, [facility] 05/19/2021 2:31 AM

## 2021-05-19 NOTE — Evaluation (Signed)
Occupational Therapy Evaluation Patient Details Name: Frank Khan MRN: [id number] DOB: 12-15-1934 Today's Date: 05/19/2021   History of Present Illness Frank Khan is an 86yoM who comes to [facility] on 12/29 after weakness, altered mental status, fall at home. PMH: pulmonary fibrosis on 2 L oxygen, CAD, stent placement, CKD-3A, hypertension, hyperlipidemia, diet-controlled diabetes, TIA, GERD, depression, anxiety. Head CT shows chronic right occipital lobe infarct. Pt has Stg 2 sacral ulcer wound care attributing to moisture/friction etiology mor elikely than pressure injury.   Clinical Impression   Mr [name] was seen for OT evaluation this date. Prior to hospital admission, pt was MOD I for household mobility using RW, assist for ADLs. Pt lives with family in home c ramped entrance. Pt presents to acute OT demonstrating impaired ADL performance and functional mobility 2/2 decreased activity tolerance, pain, and functional strength/ROM/balance deficits. Pt currently requires MAX A don/doff B socks seated EOB, pt c/o BLE pain, R greater than L with swelling/redness noted to R foot.  MAX A sit<>stand at EOB, poor standing tolerance. Good sitting balance, tolerates ~5 min c BUE support. Pt would benefit from skilled OT to address noted impairments and functional limitations (see below for any additional details) in order to maximize safety and independence while minimizing falls risk and caregiver burden. Upon hospital discharge, recommend STR to maximize pt safety and return to PLOF.       Recommendations for follow up therapy are one component of a multi-disciplinary discharge planning process, led by the attending physician.  Recommendations may be updated based on patient status, additional functional criteria and insurance authorization.   Follow Up Recommendations  Skilled nursing-short term rehab (<3 hours/day)    Assistance Recommended at Discharge Frequent or constant Supervision/Assistance   Functional Status Assessment  Patient has had a recent decline in their functional status and demonstrates the ability to make significant improvements in function in a reasonable and predictable amount of time.  Equipment Recommendations  Other (comment) (defer to next venue of care)    Recommendations for Other Services       Precautions / Restrictions Precautions Precautions: Fall Restrictions Weight Bearing Restrictions: No      Mobility Bed Mobility Overal bed mobility: Needs Assistance Bed Mobility: Supine to Sit;Sit to Supine     Supine to sit: Mod assist Sit to supine: Mod assist   General bed mobility comments: cues for sequencing and rail use    Transfers Overall transfer level: Needs assistance Equipment used: Rolling walker (2 wheels) Transfers: Sit to/from Stand Sit to Stand: Max assist;From elevated surface                  Balance Overall balance assessment: Needs assistance Sitting-balance support: No upper extremity supported;Feet unsupported Sitting balance-Leahy Scale: Fair     Standing balance support: Bilateral upper extremity supported Standing balance-Leahy Scale: Poor                             ADL either performed or assessed with clinical judgement   ADL Overall ADL's : Needs assistance/impaired                                       General ADL Comments: MAX A don B socks seated EOB. MAX A for ADL t/f, poor standing tolerance. Good sitting balance, tolerates ~5 min, no UE support  Pertinent Vitals/Pain Pain Assessment: Faces Pain Score: 10-Worst pain ever (sacral wound area, bilat knees, bilat toes) Faces Pain Scale: Hurts little more Pain Location: B feet R worse than L Pain Descriptors / Indicators: Aching;Dull;Grimacing Pain Intervention(s): Limited activity within patient's tolerance;Repositioned     Hand Dominance     Extremity/Trunk Assessment Upper Extremity Assessment Upper  Extremity Assessment: Generalized weakness   Lower Extremity Assessment Lower Extremity Assessment: Generalized weakness   Cervical / Trunk Assessment Cervical / Trunk Assessment: Kyphotic   Communication Communication Communication: HOH   Cognition Arousal/Alertness: Awake/alert Behavior During Therapy: WFL for tasks assessed/performed;Anxious Overall Cognitive Status: History of cognitive impairments - at baseline                                        Home Living Family/patient expects to be discharged to:: Private residence Living Arrangements: Alone Available Help at Discharge: Family;Available 24 hours/day (son, grandDTR, church friends, professional caregivers) Type of Home: House Home Access: Ramped entrance     Home Layout: One level               Home Equipment: Agricultural consultant (2 wheels);Grab bars - toilet          Prior Functioning/Environment Prior Level of Function : Needs assist             Mobility Comments: holusehold AMB with RW; O2 at home PRN ADLs Comments: needs assist with bathing, dressing at baseline        OT Problem List: Decreased strength;Decreased range of motion;Decreased activity tolerance;Impaired balance (sitting and/or standing);Decreased safety awareness;Pain      OT Treatment/Interventions: Therapeutic exercise;Self-care/ADL training;DME and/or AE instruction;Energy conservation;Therapeutic activities;Patient/family education;Balance training    OT Goals(Current goals can be found in the care plan section) Acute Rehab OT Goals Patient Stated Goal: to improve pain OT Goal Formulation: With patient Time For Goal Achievement: 06/02/21 Potential to Achieve Goals: Good ADL Goals Pt Will Perform Grooming: with min assist;standing (c LRAD PRN) Pt Will Perform Lower Body Dressing: with min assist;sitting/lateral leans Pt Will Transfer to Toilet: with min assist;stand pivot transfer;bedside commode (c LRAD PRN)   OT Frequency: Min 2X/week   Barriers to D/C: Inaccessible home environment          Co-evaluation              AM-PAC OT "6 Clicks" Daily Activity     Outcome Measure Help from another person eating meals?: None Help from another person taking care of personal grooming?: A Little Help from another person toileting, which includes using toliet, bedpan, or urinal?: A Lot Help from another person bathing (including washing, rinsing, drying)?: A Lot Help from another person to put on and taking off regular upper body clothing?: A Little Help from another person to put on and taking off regular lower body clothing?: A Lot 6 Click Score: 16   End of Session    Activity Tolerance: Patient tolerated treatment well Patient left: in bed;with call bell/phone within reach  OT Visit Diagnosis: Muscle weakness (generalized) (M62.81);Other abnormalities of gait and mobility (R26.89)                Time: 1950-9326 OT Time Calculation (min): 8 min Charges:  OT General Charges $OT Visit: 1 Visit OT Evaluation $OT Eval Low Complexity: 1 Low  Kathie Dike, M.S. OTR/L  05/19/21, 3:50 PM  ascom [phone]

## 2021-05-19 NOTE — ED Notes (Signed)
Pt is under bair hugger at this time. Setting is on high

## 2021-05-19 NOTE — Progress Notes (Signed)
°   05/19/21 1700  Clinical Encounter Type  Visited With Patient  Visit Type Initial;Spiritual support;Social support  Spiritual Encounters  Spiritual Needs Other (Comment)   Chaplain Burris noticed Pt while present on unit. Chaplain approached Pt and introduced herself, let Pt know he was not alone. Pt did not respond and appears to be in significant decline. Offered compassionate presence.

## 2021-05-19 NOTE — ED Notes (Signed)
Pt. Repositioned for comfort, linens and bedside table rearranged to pt's specifications. Pt. Denies any further need now. Pt. Generally unhappy.

## 2021-05-19 NOTE — H&P (Addendum)
History and Physical    Frank Khan [id number] DOB: 1935/05/06 DOA: 05/19/2021  Referring MD/NP/PA:   PCP: Rusty Aus, MD   Patient coming from:  The patient is coming from home.    Chief Complaint: Weakness, altered mental status, fall  HPI: Frank Khan is a 86 y.o. male with medical history significant of dementia, pulmonary fibrosis on 2 L oxygen, CAD, stent placement, CKD-3A, hypertension, hyperlipidemia, diet-controlled diabetes, TIA, GERD, depression, anxiety, who presents with weakness, altered mental status and fall.  Per his son (I called his son by phone), at her normal baseline, patient recognizes family, usually knows the place, but not oriented to time.  Recently patient has been declining.  He developed generalized weakness, with poor appetite and decreased oral intake.  His weakness has been progressively worsening.  He is more confused.  When I saw patient in ED, he is not orientated x3.  Patient has history of pulmonary fibrosis, with chronic dry cough and shortness breath, which has not changed significantly.  No chest pain.  No nausea, vomiting, diarrhea or abdominal pain.  Patient complains of dysuria, burning on urination and increased urinary frequency, but his urine analysis is negative today.  Per his son, patient fell yesterday accidentally.  No significant injury.  ED Course: pt was found to have WBC 9.4, lactic acid 3.1, 1.0, INR 1.0, negative COVID PCR, lipase 52, negative urinalysis, slightly worsening renal function, hypothermia with temperature 96.3, blood pressure 100/69, heart rate 105, RR 23, oxygen saturation 92-99% on 4 L oxygen.  Chest x-ray showed pulmonary fibrosis without infiltration.  CT of head is negative.  CT of C-spine is negative for acute injury but showed degenerative disc disease.  CT of abdomen/pelvis is negative for acute intra-abdominal issues.  Patient is admitted to progressive bed as inpatient.   Review of Systems: Could not be  reviewed accurately due to dementia and altered mental status.  Allergy:  Allergies  Allergen Reactions   Doxycycline     [facility], reaction not [facility]    Penicillins Hives    Has patient had a PCN reaction causing immediate rash, facial/tongue/throat swelling, SOB or lightheadedness with hypotension: yes Has patient had a PCN reaction causing severe rash involving mucus membranes or skin necrosis: no Has patient had a PCN reaction that required hospitalization no Has patient had a PCN reaction occurring within the last 10 years: no If all of the above answers are "NO", then may proceed with Cephalosporin use.     Simvastatin     [facility] records, reaction not given   Chlorhexidine Itching   Iodine Rash    Past Medical History:  Diagnosis Date   COVID-19 08/01/2019   Diagnosed on 08/01/2019   Dementia ([place])    Diabetes mellitus without complication ([place])    Hypertension    Pulmonary fibrosis (HCC)    TIA (transient ischemic attack)     Past Surgical History:  Procedure Laterality Date   CORONARY ANGIOPLASTY WITH STENT PLACEMENT      Social History:  reports that he has quit smoking. He has never used smokeless tobacco. He reports that he does not drink alcohol and does not use drugs.  Family History:  Family History  Problem Relation Age of Onset   Hyperlipidemia Sister      Prior to Admission medications   Medication Sig Start Date End Date Taking? Authorizing Provider  aspirin 81 MG chewable tablet Chew 81 mg by mouth daily.  Yes [provider]  ezetimibe (ZETIA) 10 MG tablet Take 10 mg by mouth daily.   Yes [provider]  hydrOXYzine (ATARAX) 25 MG tablet Take 25 mg by mouth 2 (two) times daily as needed for itching.   Yes [provider]  ibuprofen (ADVIL) 800 MG tablet Take 1 tablet (800 mg total) by mouth every 8 (eight) hours as needed. 11/09/19  Yes Lynden Oxford R, PA-C  mirtazapine (REMERON) 7.5 MG tablet Take 15 mg by  mouth at bedtime. 01/20/21  Yes [provider]  pantoprazole (PROTONIX) 40 MG tablet Take 40 mg by mouth daily.   Yes [provider]  sertraline (ZOLOFT) 50 MG tablet Take 50 mg by mouth daily.   Yes [provider]  cetirizine (ZYRTEC) 10 MG tablet Take 10 mg by mouth daily. Patient not taking: Reported on 05/19/2021    [provider]    Physical Exam: Vitals:   05/19/21 0915 05/19/21 0930 05/19/21 0930 05/19/21 1030  BP:  103/68  111/64  Pulse: 91 98  96  Resp:      Temp:   (!) 97.1 F (36.2 C)   TempSrc:   Rectal   SpO2: 97% 94%  96%  Weight:      Height:       General: Not in acute distress HEENT:       Eyes: PERRL, EOMI, no scleral icterus.       ENT: No discharge from the ears and nose       Neck: No JVD, no bruit, no mass felt. Heme: No neck lymph node enlargement. Cardiac: S1/S2, RRR, No murmurs, No gallops or rubs. Respiratory: No rales, wheezing, rhonchi or rubs.  Has decreased air movement bilaterally GI: Soft, nondistended, nontender, no organomegaly, BS present. GU: No hematuria Ext: has trace leg edema bilaterally. 1+DP/PT pulse bilaterally. Musculoskeletal: No joint deformities, No joint redness or warmth, no limitation of ROM in spin. Skin: Has mild erythema in the anterior lower legs, no warmth. Has sacral pressure ulcer, seems to be stage II            Neuro: Patient is confused, not orientated x3, not following commands, oriented X3, cranial nerves II-XII grossly intact, moves all extremities Psych: Patient is not psychotic, no suicidal or hemocidal ideation.  Labs on Admission: I have personally reviewed following labs and imaging studies  CBC: Recent Labs  Lab 05/19/21 0025  WBC 9.4  NEUTROABS 8.3*  HGB 14.4  HCT 44.0  MCV 95.4  PLT 158   Basic Metabolic Panel: Recent Labs  Lab 05/19/21 0025  NA 137  K 4.1  CL 105  CO2 24  GLUCOSE 198*  BUN 23  CREATININE 1.29*  CALCIUM 8.7*    GFR: Estimated Creatinine Clearance: 32.8 mL/min (A) (by C-G formula based on SCr of 1.29 mg/dL (H)). Liver Function Tests: Recent Labs  Lab 05/19/21 0025  AST 35  ALT 18  ALKPHOS 94  BILITOT 0.9  PROT 6.7  ALBUMIN 3.5   Recent Labs  Lab 05/19/21 0025  LIPASE 52*   No results for input(s): AMMONIA in the last 168 hours. Coagulation Profile: Recent Labs  Lab 05/19/21 0025  INR 1.0   Cardiac Enzymes: No results for input(s): CKTOTAL, CKMB, CKMBINDEX, TROPONINI in the last 168 hours. BNP (last 3 results) No results for input(s): PROBNP in the last 8760 hours. HbA1C: No results for input(s): HGBA1C in the last 72 hours. CBG: No results for input(s): GLUCAP in the last  168 hours. Lipid Profile: No results for input(s): CHOL, HDL, LDLCALC, TRIG, CHOLHDL, LDLDIRECT in the last 72 hours. Thyroid Function Tests: No results for input(s): TSH, T4TOTAL, FREET4, T3FREE, THYROIDAB in the last 72 hours. Anemia Panel: No results for input(s): VITAMINB12, FOLATE, FERRITIN, TIBC, IRON, RETICCTPCT in the last 72 hours. Urine analysis:    Component Value Date/Time   COLORURINE YELLOW (A) 05/19/2021 0346   APPEARANCEUR HAZY (A) 05/19/2021 0346   LABSPEC 1.031 (H) 05/19/2021 0346   PHURINE 5.0 05/19/2021 0346   GLUCOSEU NEGATIVE 05/19/2021 0346   HGBUR NEGATIVE 05/19/2021 0346   BILIRUBINUR NEGATIVE 05/19/2021 0346   KETONESUR NEGATIVE 05/19/2021 0346   PROTEINUR 30 (A) 05/19/2021 0346   NITRITE NEGATIVE 05/19/2021 0346   LEUKOCYTESUR NEGATIVE 05/19/2021 0346   Sepsis Labs: @LABRCNTIP (procalcitonin:4,lacticidven:4) ) Recent Results (from the past 240 hour(s))  Culture, blood (Routine x 2)     Status: None (Preliminary result)   Collection Time: 05/19/21 12:25 AM   Specimen: BLOOD  Result Value Ref Range Status   Specimen Description BLOOD RIGHT FOREARM  Final   Special Requests   Final    BOTTLES DRAWN AEROBIC AND ANAEROBIC Blood Culture results may not be optimal due to  an inadequate volume of blood received in culture bottles   Culture   Final    NO GROWTH < 12 HOURS Performed at [facility], [address] [place], [place] [address]    Report Status PENDING  Incomplete  Culture, blood (Routine x 2)     Status: None (Preliminary result)   Collection Time: 05/19/21 12:25 AM   Specimen: BLOOD  Result Value Ref Range Status   Specimen Description BLOOD RIGHT FOREARM  Final   Special Requests   Final    BOTTLES DRAWN AEROBIC AND ANAEROBIC Blood Culture results may not be optimal due to an inadequate volume of blood received in culture bottles   Culture   Final    NO GROWTH < 12 HOURS Performed at [facility], [address] [place], [place] [address]    Report Status PENDING  Incomplete  Resp Panel by RT-PCR (Flu A&B, Covid) Nasopharyngeal Swab     Status: None   Collection Time: 05/19/21 12:25 AM   Specimen: Nasopharyngeal Swab; Nasopharyngeal(NP) swabs in vial transport medium  Result Value Ref Range Status   SARS Coronavirus 2 by RT PCR NEGATIVE NEGATIVE Final    Comment: (NOTE) SARS-CoV-2 target nucleic acids are NOT DETECTED.  The SARS-CoV-2 RNA is generally detectable in upper respiratory specimens during the acute phase of infection. The lowest concentration of SARS-CoV-2 viral copies this assay can detect is 138 copies/mL. A negative result does not preclude SARS-Cov-2 infection and should not be used as the sole basis for treatment or other patient management decisions. A negative result may occur with  improper specimen collection/handling, submission of specimen other than nasopharyngeal swab, presence of viral mutation(s) within the areas targeted by this assay, and inadequate number of viral copies(<138 copies/mL). A negative result must be combined with clinical observations, patient history, and epidemiological information. The expected result is Negative.  Fact Sheet for Patients:   EntrepreneurPulse.com.au  Fact Sheet for Healthcare Providers:  IncredibleEmployment.be  This test is no t yet approved or cleared by the [place] FDA and  has been authorized for detection and/or diagnosis of SARS-CoV-2 by FDA under an Emergency Use Authorization (EUA). This EUA will remain  in effect (meaning this test can be used) for the duration of the COVID-19  declaration under Section 564(b)(1) of the Act, 21 U.S.C.section 360bbb-3(b)(1), unless the authorization is terminated  or revoked sooner.       Influenza A by PCR NEGATIVE NEGATIVE Final   Influenza B by PCR NEGATIVE NEGATIVE Final    Comment: (NOTE) The Xpert Xpress SARS-CoV-2/FLU/RSV plus assay is intended as an aid in the diagnosis of influenza from Nasopharyngeal swab specimens and should not be used as a sole basis for treatment. Nasal washings and aspirates are unacceptable for Xpert Xpress SARS-CoV-2/FLU/RSV testing.  Fact Sheet for Patients: EntrepreneurPulse.com.au  Fact Sheet for Healthcare Providers: IncredibleEmployment.be  This test is not yet approved or cleared by the [place] FDA and has been authorized for detection and/or diagnosis of SARS-CoV-2 by FDA under an Emergency Use Authorization (EUA). This EUA will remain in effect (meaning this test can be used) for the duration of the COVID-19 declaration under Section 564(b)(1) of the Act, 21 U.S.C. section 360bbb-3(b)(1), unless the authorization is terminated or revoked.  Performed at [facility], [address] [place], [place] [address]      Radiological Exams on Admission: CT HEAD WO CONTRAST (5MM)  Result Date: 05/19/2021 CLINICAL DATA:  Increasing weakness and falls. EXAM: CT HEAD WITHOUT CONTRAST TECHNIQUE: Contiguous axial images were obtained from the base of the skull through the vertex without intravenous contrast. COMPARISON:  January 10, 2020 FINDINGS: Brain: There is moderate severity cerebral atrophy with widening of the extra-axial spaces and ventricular dilatation. There are areas of decreased attenuation within the white matter tracts of the supratentorial brain, consistent with microvascular disease changes. A chronic right occipital lobe infarct is noted. A punctate calcification is again seen within the central pons. Vascular: No hyperdense vessel or unexpected calcification. Skull: Normal. Negative for fracture or focal lesion. Sinuses/Orbits: No acute finding. Other: None. IMPRESSION: 1. Generalized cerebral atrophy. 2. Chronic right occipital lobe infarct. 3. No acute intracranial abnormality. Electronically Signed   By: Virgina Norfolk M.D.   On: 05/19/2021 03:06   CT Cervical Spine Wo Contrast  Result Date: 05/19/2021 CLINICAL DATA:  Increasing weakness and falls. EXAM: CT CERVICAL SPINE WITHOUT CONTRAST TECHNIQUE: Multidetector CT imaging of the cervical spine was performed without intravenous contrast. Multiplanar CT image reconstructions were also generated. COMPARISON:  August 02, 2019 FINDINGS: Alignment: Stable, approximately 1 mm retrolisthesis of the C4 vertebral body is noted on C5. Skull base and vertebrae: No acute fracture. No primary bone lesion or focal pathologic process. Soft tissues and spinal canal: No prevertebral fluid or swelling. No visible canal hematoma. Disc levels: Mild to moderate severity endplate sclerosis is seen at the level of C3-C4 with moderate to marked severity endplate sclerosis seen at the levels of C4-C5 and C5-C6. There is marked severity narrowing of the anterior atlantoaxial articulation. Mild intervertebral disc space narrowing is seen at the level of C2-C3. Marked severity intervertebral disc space narrowing is seen at the levels of C4-C5 and C5-C6 and along the posterior aspect of the C3-C4 level. Bilateral moderate to marked severity multilevel facet joint hypertrophy is noted.  Upper chest: There is mild biapical scarring and/or atelectasis. Other: None. IMPRESSION: 1. Marked severity multilevel degenerative changes, most prominent at the levels of C4-C5 and C5-C6. 2. No evidence of an acute cervical spine fracture. Electronically Signed   By: Virgina Norfolk M.D.   On: 05/19/2021 03:13   CT ABDOMEN PELVIS W CONTRAST  Result Date: 05/19/2021 CLINICAL DATA:  Weakness and falls. EXAM: CT ABDOMEN AND PELVIS WITH CONTRAST TECHNIQUE: Multidetector  CT imaging of the abdomen and pelvis was performed using the standard protocol following bolus administration of intravenous contrast. CONTRAST:  65mL OMNIPAQUE IOHEXOL 300 MG/ML  SOLN COMPARISON:  November 08, 2019 FINDINGS: Lower chest: Changes consistent with pulmonary fibrosis are again seen within the bilateral lung bases. A very small right pleural effusion is seen. Hepatobiliary: No focal liver abnormality is seen. The gallbladder is not clearly identified. Pancreas: Unremarkable. No pancreatic ductal dilatation or surrounding inflammatory changes. Spleen: Normal in size without focal abnormality. Adrenals/Urinary Tract: Adrenal glands are unremarkable. The right kidney is normal in size. Atrophic changes and cortical thinning are seen along the anterior and lateral aspects of the mid to lower left kidney. 2 mm nonobstructing renal calculi are seen within the posterior aspect of the mid right kidney and anterolateral aspect of the mid to upper left kidney. Bladder is unremarkable. Stomach/Bowel: Stomach is within normal limits. The appendix is not visualized. No evidence of bowel dilatation. Stool is seen throughout the colon. Noninflamed diverticula are seen throughout the large bowel. Vascular/Lymphatic: Marked severity aortic atherosclerosis. No enlarged abdominal or pelvic lymph nodes. Reproductive: The prostate gland is mildly enlarged. Other: No abdominal wall hernia or abnormality. No abdominopelvic ascites. Musculoskeletal:  Multilevel degenerative changes seen throughout the lumbar spine. IMPRESSION: 1. Findings consistent with bibasilar pulmonary fibrosis. 2. Very small right pleural effusion. 3. Bilateral nonobstructing renal calculi. 4. Atrophic changes and cortical thinning of the mid to lower left kidney. 5. Colonic diverticulosis. 6. Marked severity aortic atherosclerosis. Aortic Atherosclerosis (ICD10-I70.0). Electronically Signed   By: Virgina Norfolk M.D.   On: 05/19/2021 03:21   DG Chest Port 1 View  Result Date: 05/19/2021 CLINICAL DATA:  Increasing weakness, multiple falls, confusion EXAM: PORTABLE CHEST 1 VIEW COMPARISON:  08/02/2019 FINDINGS: Single frontal view of the chest demonstrates an unremarkable cardiac silhouette. Stable bilateral parenchymal scarring and fibrosis, greatest at the bases. No acute airspace disease, effusion, or pneumothorax. No acute bony abnormalities. IMPRESSION: 1. Bilateral parenchymal lung scarring and fibrosis. No acute airspace disease. Electronically Signed   By: Randa Ngo M.D.   On: 05/19/2021 00:25     EKG: I have personally reviewed.  Sinus rhythm, QTC 510, bifascicular block, early R wave progression  Assessment/Plan Principal Problem:   SIRS (systemic inflammatory response syndrome) (HCC) Active Problems:   TIA (transient ischemic attack)   Pulmonary fibrosis (HCC)   Hypertension   Type II diabetes mellitus with renal manifestations (HCC)   CKD (chronic kidney disease), stage IIIa   HLD (hyperlipidemia)   Depression with anxiety   CAD (coronary artery disease)   Sacral ulcer (HCC)   Acute metabolic encephalopathy   Fall at home, initial encounter   SIRS (systemic inflammatory response syndrome) [facility]): Patient meets criteria for SIRS with hypothermia with temperature 96.3, tachycardia with heart rate 105, tachypnea with RR 23.  Lactic acid is elevated 3.1 which is normalized with IV fluid treatment.  No source of infection identified.  Urinalysis  negative.  Chest x-ray did not show infiltration.  COVID PCR negative.  On physical examination, patient has stage II sacral ulcer, which does not seem to have cellulitis.  Patient also has very mild erythema in the anterior lower legs, but no warmth, does not seem to have cellulitis.  Patient's procalcitonin is < 0.10.  Clinically does not seem to have sepsis.  Patient received 1 dose of vancomycin, cefepime and Flagyl.  We will hold off antibiotics now.  Will observe patient closely.  If patient develop signs of  infection, such as fever or leukocytosis, or positive for culture, may restart antibiotics.  Will follow-up blood culture and urine culture.  -Admitted to med-surg bed as inpt -Hold off antibiotics now -Follow-up blood culture and urine culture -IVF:1.75 L of LR in ED, followed by 75 cc/h   Fall at home, initial encounter: Imaging negative for acute injury.  CT head negative. -Fall precaution -PT/OT -Consult TOC for home health need and possible SNF placement  TIA (transient ischemic attack): -Aspirin, Zetia  HLD: -Zetia  Pulmonary fibrosis ([place]) -Bronchodilators  Hypertension: Patient's not taking medications currently.  Blood pressure 100/69 -IV hydralazine as needed  Type II diabetes mellitus with renal manifestations [facility]): Recent A1c 5.8.  Well-controlled.  Patient not taking medications currently. -Check CBG every morning  CKD (chronic kidney disease), stage IIIa: Slightly worsening.  Recent baseline creatinine 1.1 on 12/30/2020.  His creatinine is 1.29, BUN 23. -IV fluid as above -Hold ibuprofen  Depression with anxiety -Continue home medications  CAD (coronary artery disease) -Aspirin, Zetia  Acute metabolic encephalopathy: Likely multifactorial etiology including SIRS, worsening renal function.  No focal neuro deficit on physical examination.  CT head negative for -Frequent neurochecks  Sacral ulcer ([place]): Seems to be stage II sacral ulcer -Wound care  consult     DVT ppx:  SQ Lovenox Code Status: DNR per his son Family Communication: Yes, patient's son by phone Disposition Plan: to be determined Consults called:  none Admission status and Level of care: Progressive:   as inpt      Status is: Inpatient  Remains inpatient appropriate because: Patient has some multiple comorbidities, now presents with SIRS without clear source of infection identified, hypothermia, fall, altered mental status, generalized weakness.  His presentation is highly complicated.  Patient is at high risk of deteriorating given his older age.  Patient needs to be treated in hospital for at least 2 days.          Date of Service 05/19/2021    Ivor Costa [facility]   If 7PM-7AM, please contact night-coverage [email or web address] 05/19/2021, 10:55 AM

## 2021-05-19 NOTE — Consult Note (Addendum)
WOC Nurse Consult Note: Reason for Consult: Consult requested for sacrum.  Performed remotely after review of progress notes and photos in the EMR.  Wound type: Sacrum with patchy areas of 80% red/20% yellow dry partial thickness skin loss with loose peeling skin surrounding.  Appearance is consistent with moisture associated skin damage, not a pressure injury.    ICD-10 CM Codes for Irritant Dermatitis L24A2 - Due to fecal, urinary or dual incontinence  Bilat legs with yellow dry scabbed skin, appearance is consistent with venous stasis changes.  Dressing procedure/placement/frequency: Topical treatment orders provided for bedside nurses to perform as follows to protect from further injury:  Foam dressing to sacrum, change Q 3 days or PRN soiling Apply Eucerin cream to bilat legs Q day after bathing Please re-consult if further assistance is needed.  Thank-you,  Cammie Mcgee MSN, RN, CWOCN, [place], CNS [phone]

## 2021-05-19 NOTE — ED Notes (Signed)
Informed RN bed assigned 

## 2021-05-19 NOTE — Sepsis Progress Note (Signed)
Following per sepsis protocol   

## 2021-05-19 NOTE — ED Notes (Signed)
Initial contacy, This RN to bedside to assess pt. Pt. Is unhappy with his breakfast tray that is sitting in front of him, states it is cold. This RN offers to heat up plate of food. When this RN returns with food, pt's lunch tray is now at bedside. This RN prepares pt's lunch food, cuts up food into bite size pieces and insures pt. Can manipulate utensils and food to feed himself. Pt. Is able to feed himself and drink using cup and straw. Pt. Encouraged to eat. Bear hugger is still in place.

## 2021-05-19 NOTE — Evaluation (Addendum)
Physical Therapy Evaluation Patient Details Name: Frank Khan MRN: [id number] DOB: 1934-11-18 Today's Date: 05/19/2021  History of Present Illness  Frank Khan is an 86yoM who comes to [facility] on 12/29 after weakness, altered mental status, fall at home. PMH: pulmonary fibrosis on 2 L oxygen, CAD, stent placement, CKD-3A, hypertension, hyperlipidemia, diet-controlled diabetes, TIA, GERD, depression, anxiety. Head CT shows chronic right occipital lobe infarct. Pt has Stg 2 sacral ulcer wound care attributing to moisture/friction etiology mor elikely than pressure injury.  Clinical Impression  Pt admitted with above diagnosis. Pt currently with functional limitations due to the deficits listed below (see "PT Problem List"). Upon entry, pt in bed, awake and agreeable to participate. The pt is alert, pleasant, interactive, and able to provide info regarding prior level of function, both in tolerance and independence. Pt on 5L/min O2 today, reports PRN use PTA, but today drops to 50s% on room air trial. Pt c/o significant pain in low back (sounds as if he is describing his area of sacral skin injury), toes (?related to cellulitis), and knees bilat- all of which is new. Pt having difficulty finding position of comfort due to these areas. Pt assisted with pull up in bed as he has slid down, heels resting on metal rail at end of bed. Pt repositioned and heels elevated. Pt much weaker than baseline mobility. He has 24/7 caregiver assistance at home, but is is not clear that they can provide totalA for mobility needs- in light of that STR remains the most pragmatic option to rehabilitate strength prior to return to home. Patient's performance this date reveals decreased ability, independence, and tolerance in performing all basic mobility required for performance of activities of daily living. Pt requires additional DME, close physical assistance, and cues for safe participate in mobility. Pt will benefit from skilled  PT intervention to increase independence and safety with basic mobility in preparation for discharge to the venue listed below.          Recommendations for follow up therapy are one component of a multi-disciplinary discharge planning process, led by the attending physician.  Recommendations may be updated based on patient status, additional functional criteria and insurance authorization.  Follow Up Recommendations Skilled nursing-short term rehab (<3 hours/day)    Assistance Recommended at Discharge Frequent or constant Supervision/Assistance  Functional Status Assessment Patient has had a recent decline in their functional status and demonstrates the ability to make significant improvements in function in a reasonable and predictable amount of time.  Equipment Recommendations       Recommendations for Other Services       Precautions / Restrictions Precautions Precautions: Fall Restrictions Weight Bearing Restrictions: No      Mobility  Bed Mobility               General bed mobility comments: pt unable to scoot up in bed, makes attempts with little movement; totalA scoot up in bed    Transfers Overall transfer level:  (not appropriate at this time; pain inadequately controlled, pt anxious and weak.)                      Ambulation/Gait                  Stairs            Wheelchair Mobility    Modified Rankin (Stroke Patients Only)       Balance  Pertinent Vitals/Pain Pain Assessment: 0-10 Pain Score: 10-Worst pain ever (sacral wound area, bilat knees, bilat toes)    Home Living Family/patient expects to be discharged to:: Private residence Living Arrangements: Alone (wife dies last year) Available Help at Discharge: Family;Available 24 hours/day (son, grandDTR, church friends, professional caregivers) Type of Home: House Home Access: Ramped entrance       Home  Layout: One level Home Equipment: Agricultural consultant (2 wheels);Grab bars - toilet      Prior Function Prior Level of Function : Needs assist             Mobility Comments: holusehold AMB with RW; O2 at home PRN ADLs Comments: needs assist with bathing, dressign at baseline     Hand Dominance        Extremity/Trunk Assessment   Upper Extremity Assessment Upper Extremity Assessment: Generalized weakness (grips strong, elbow flexion 5/5, elbow extension 4/5, sucessful hand to crown of head bilat)    Lower Extremity Assessment Lower Extremity Assessment:  (unable to assess due to pain)    Cervical / Trunk Assessment Cervical / Trunk Assessment: Kyphotic  Communication      Cognition Arousal/Alertness: Awake/alert Behavior During Therapy: WFL for tasks assessed/performed;Anxious Overall Cognitive Status: History of cognitive impairments - at baseline                                          General Comments      Exercises     Assessment/Plan    PT Assessment Patient needs continued PT services  PT Problem List Decreased strength;Decreased range of motion;Decreased activity tolerance;Decreased balance;Decreased mobility;Decreased cognition;Decreased knowledge of use of DME;Decreased safety awareness;Decreased knowledge of precautions;Cardiopulmonary status limiting activity       PT Treatment Interventions DME instruction;Gait training;Stair training;Functional mobility training;Therapeutic activities;Therapeutic exercise;Balance training;Neuromuscular re-education;Cognitive remediation;Patient/family education    PT Goals (Current goals can be found in the Care Plan section)  Acute Rehab PT Goals PT Goal Formulation: Patient unable to participate in goal setting    Frequency Min 2X/week   Barriers to discharge        Co-evaluation               AM-PAC PT "6 Clicks" Mobility  Outcome Measure Help needed turning from your back to your  side while in a flat bed without using bedrails?: Total Help needed moving from lying on your back to sitting on the side of a flat bed without using bedrails?: Total Help needed moving to and from a bed to a chair (including a wheelchair)?: Total Help needed standing up from a chair using your arms (e.g., wheelchair or bedside chair)?: Total Help needed to walk in hospital room?: Total Help needed climbing 3-5 steps with a railing? : Total 6 Click Score: 6    End of Session Equipment Utilized During Treatment: Oxygen Activity Tolerance: Patient limited by fatigue;Patient limited by lethargy;Patient limited by pain Patient left: in bed;with call bell/phone within reach   PT Visit Diagnosis: Difficulty in walking, not elsewhere classified (R26.2);Other abnormalities of gait and mobility (R26.89);Muscle weakness (generalized) (M62.81)    Time: 1660-6301 PT Time Calculation (min) (ACUTE ONLY): 21 min   Charges:   PT Evaluation $PT Eval Moderate Complexity: 1 Mod         3:32 PM, 05/19/21 Rosamaria Lints, PT, DPT Physical Therapist - [facility]  [phone] [facility])  Tej Murdaugh C 05/19/2021, 3:26 PM

## 2021-05-19 NOTE — ED Notes (Signed)
Bear hugger turned off 

## 2021-05-20 DIAGNOSIS — N179 Acute kidney failure, unspecified: Secondary | ICD-10-CM

## 2021-05-20 LAB — BASIC METABOLIC PANEL
Anion gap: 5 (ref 5–15)
BUN: 21 mg/dL (ref 8–23)
CO2: 27 mmol/L (ref 22–32)
Calcium: 8.7 mg/dL — ABNORMAL LOW (ref 8.9–10.3)
Chloride: 105 mmol/L (ref 98–111)
Creatinine, Ser: 1.32 mg/dL — ABNORMAL HIGH (ref 0.61–1.24)
GFR, Estimated: 53 mL/min — ABNORMAL LOW (ref >60)
Glucose, Bld: 70 mg/dL (ref 70–99)
Potassium: 4.7 mmol/L (ref 3.5–5.1)
Sodium: 137 mmol/L (ref 135–145)

## 2021-05-20 LAB — CBC
HCT: 41.5 % (ref 39.0–52.0)
Hemoglobin: 13.5 g/dL (ref 13.0–17.0)
MCH: 32.2 pg (ref 26.0–34.0)
MCHC: 32.5 g/dL (ref 30.0–36.0)
MCV: 99 fL (ref 80.0–100.0)
Platelets: 146 10*3/uL — ABNORMAL LOW (ref 150–400)
RBC: 4.19 MIL/uL — ABNORMAL LOW (ref 4.22–5.81)
RDW: 14.4 % (ref 11.5–15.5)
WBC: 7.3 10*3/uL (ref 4.0–10.5)
nRBC: 0 % (ref 0.0–0.2)

## 2021-05-20 LAB — URINE CULTURE: Culture: NO GROWTH

## 2021-05-20 LAB — GLUCOSE, CAPILLARY: Glucose-Capillary: 85 mg/dL (ref 70–99)

## 2021-05-20 MED ORDER — SODIUM CHLORIDE 0.9 % IV SOLN: INTRAVENOUS | Status: DC

## 2021-05-20 NOTE — Plan of Care (Signed)

## 2021-05-20 NOTE — Progress Notes (Signed)
°   05/20/21 0918  Assess: MEWS Score  Temp 97.8 F (36.6 C)  BP 102/66  Pulse Rate 97  Resp 16  Level of Consciousness Alert  SpO2 97 %  O2 Device Nasal Cannula  O2 Flow Rate (L/min) 3 L/min  Assess: MEWS Score  MEWS Temp 0  MEWS Systolic 0  MEWS Pulse 0  MEWS RR 0  MEWS LOC 0  MEWS Score 0  MEWS Score Color Green  Assess: if the MEWS score is Yellow or Red  Were vital signs taken at a resting state? Yes  Focused Assessment No change from prior assessment  Does the patient meet 2 or more of the SIRS criteria? No  MEWS guidelines implemented *See Row Information* No, vital signs rechecked  Document  Progress note created (see row info) Yes  Assess: SIRS CRITERIA  SIRS Temperature  0  SIRS Pulse 1  SIRS Respirations  0  SIRS WBC 0  SIRS Score Sum  1

## 2021-05-20 NOTE — Progress Notes (Signed)
PROGRESS NOTE    Frank Khan  [id number] DOB: [birth year] DOA: 05/19/2021 PCP: Rusty Aus, MD    Brief Narrative:  86 y.o. male with medical history significant of dementia, pulmonary fibrosis on 2 L oxygen, CAD, stent placement, CKD-3A, hypertension, hyperlipidemia, diet-controlled diabetes, TIA, GERD, depression, anxiety, who presents with weakness, altered mental status and fall. Pt was admitted for w/u of SIRS criteria with lactate  Assessment & Plan:   Principal Problem:   SIRS (systemic inflammatory response syndrome) (HCC) Active Problems:   TIA (transient ischemic attack)   Pulmonary fibrosis (HCC)   Hypertension   Type II diabetes mellitus with renal manifestations (HCC)   CKD (chronic kidney disease), stage IIIa   HLD (hyperlipidemia)   Depression with anxiety   CAD (coronary artery disease)   Sacral ulcer (HCC)   Acute metabolic encephalopathy   Fall at home, initial encounter  SIRS (systemic inflammatory response syndrome) ([place]):  -Patient meets criteria for SIRS with hypothermia with temperature 96.3, tachycardia with heart rate 105, tachypnea with RR 23.  Lactic acid is elevated 3.1 which is normalized with IV fluid treatment.   -Thus far, no source of infection identified.  Urinalysis negative.  Chest x-ray did not show infiltration.  COVID PCR negative.   -procalcitonin is < 0.10.  -Will follow-up blood culture and urine culture. -Continued on IVF hydration   Fall at home, initial encounter: Imaging negative for acute injury.  CT head negative. -Fall precaution -PT/OT consulted with recs for SNF -TOC consulted   TIA (transient ischemic attack): -Cont Aspirin, Zetia   HLD: -Zetia   Pulmonary fibrosis (HCC) -Currently on 3LNC, baseline 2LNC -On bronchodilators   Hypertension:  -Cont IV hydralazine as needed -BP soft   Type II diabetes mellitus with renal manifestations ([place]):  -Recent A1c 5.8.  Well-controlled.  Patient not taking  medications currently. -Check CBG every morning   CKD (chronic kidney disease), stage IIIa: Slightly worsening.  Recent baseline creatinine 1.1 on 12/30/2020.  His creatinine is 1.29, BUN 23. -IV fluid as above -Hold ibuprofen   Depression with anxiety -Continue home medications   CAD (coronary artery disease) -Aspirin, Zetia   Acute metabolic encephalopathy:  -Likely multifactorial etiology including SIRS, worsening renal function.  No focal neuro deficit on physical examination.   -CT head with generalized cerebral atrophy, chronic R occipital lobe infarct  Moisture associated skin injury ruled in, Sacral ulcer ruled out [facility]):  -appreciate input by WOC  Dehydration -Pt with dry mucus membranes, appears clinically dehydrated -Will continue on basal IVF -recheck bmet in AM    DVT prophylaxis: Lovenox subq Code Status: DNR Family Communication: Pt in room, family not at bedside  Status is: Inpatient  Remains inpatient appropriate because: Severity of illness and disposition issues   Consultants:    Procedures:    Antimicrobials: Anti-infectives (From admission, onward)    Start     Dose/Rate Route Frequency Ordered Stop   05/19/21 0230  ceFEPIme (MAXIPIME) 2 g in sodium chloride 0.9 % 100 mL IVPB        2 g 200 mL/hr over 30 Minutes Intravenous  Once 05/19/21 0226 05/19/21 0329   05/19/21 0230  metroNIDAZOLE (FLAGYL) IVPB 500 mg        500 mg 100 mL/hr over 60 Minutes Intravenous  Once 05/19/21 0226 05/19/21 0501   05/19/21 0230  vancomycin (VANCOCIN) IVPB 1000 mg/200 mL premix        1,000 mg 200 mL/hr over 60 Minutes Intravenous  Once  05/19/21 0226 05/19/21 0658       Subjective: Pleasantly confused, without complaints  Objective: Vitals:   05/20/21 0349 05/20/21 0818 05/20/21 0918 05/20/21 1200  BP: 115/78 (!) 79/52 102/66 94/62  Pulse: 97 99 97 99  Resp: 15 16 16 16   Temp: 97.9 F (36.6 C) 97.8 F (36.6 C) 97.8 F (36.6 C) 98 F (36.7 C)   TempSrc:  Oral Oral Oral  SpO2: 100% 100% 97% 100%  Weight:      Height:        Intake/Output Summary (Last 24 hours) at 05/20/2021 1541 Last data filed at 05/20/2021 1417 Gross per 24 hour  Intake 145 ml  Output 200 ml  Net -55 ml   Filed Weights   05/18/21 2354  Weight: 54.4 kg    Examination: General exam: Awake, laying in bed, in nad, mucus membranes dry Respiratory system: Normal respiratory effort, no wheezing Cardiovascular system: regular rate, s1, s2 Gastrointestinal system: Soft, nondistended, positive BS Central nervous system: CN2-12 grossly intact, strength intact Extremities: Perfused, no clubbing Skin: Normal skin turgor, no notable skin lesions seen Psychiatry: difficult to assess given mentation  Data Reviewed: I have personally reviewed following labs and imaging studies  CBC: Recent Labs  Lab 05/19/21 0025 05/20/21 0429  WBC 9.4 7.3  NEUTROABS 8.3*  --   HGB 14.4 13.5  HCT 44.0 41.5  MCV 95.4 99.0  PLT 158 [id number]*   Basic Metabolic Panel: Recent Labs  Lab 05/19/21 0025 05/20/21 0429  NA 137 137  K 4.1 4.7  CL 105 105  CO2 24 27  GLUCOSE 198* 70  BUN 23 21  CREATININE 1.29* 1.32*  CALCIUM 8.7* 8.7*   GFR: Estimated Creatinine Clearance: 32.1 mL/min (A) (by C-G formula based on SCr of 1.32 mg/dL (H)). Liver Function Tests: Recent Labs  Lab 05/19/21 0025  AST 35  ALT 18  ALKPHOS 94  BILITOT 0.9  PROT 6.7  ALBUMIN 3.5   Recent Labs  Lab 05/19/21 0025  LIPASE 52*   No results for input(s): AMMONIA in the last 168 hours. Coagulation Profile: Recent Labs  Lab 05/19/21 0025  INR 1.0   Cardiac Enzymes: No results for input(s): CKTOTAL, CKMB, CKMBINDEX, TROPONINI in the last 168 hours. BNP (last 3 results) No results for input(s): PROBNP in the last 8760 hours. HbA1C: No results for input(s): HGBA1C in the last 72 hours. CBG: Recent Labs  Lab 05/20/21 0742  GLUCAP 85   Lipid Profile: No results for input(s): CHOL,  HDL, LDLCALC, TRIG, CHOLHDL, LDLDIRECT in the last 72 hours. Thyroid Function Tests: No results for input(s): TSH, T4TOTAL, FREET4, T3FREE, THYROIDAB in the last 72 hours. Anemia Panel: No results for input(s): VITAMINB12, FOLATE, FERRITIN, TIBC, IRON, RETICCTPCT in the last 72 hours. Sepsis Labs: Recent Labs  Lab 05/19/21 0025 05/19/21 0610  PROCALCITON <0.10  --   LATICACIDVEN 3.1* 1.0    Recent Results (from the past 240 hour(s))  Culture, blood (Routine x 2)     Status: None (Preliminary result)   Collection Time: 05/19/21 12:25 AM   Specimen: BLOOD  Result Value Ref Range Status   Specimen Description BLOOD RIGHT FOREARM  Final   Special Requests   Final    BOTTLES DRAWN AEROBIC AND ANAEROBIC Blood Culture results may not be optimal due to an inadequate volume of blood received in culture bottles   Culture   Final    NO GROWTH 1 DAY Performed at [facility], [place],  [place], [place] [address]    Report Status PENDING  Incomplete  Culture, blood (Routine x 2)     Status: None (Preliminary result)   Collection Time: 05/19/21 12:25 AM   Specimen: BLOOD  Result Value Ref Range Status   Specimen Description BLOOD RIGHT FOREARM  Final   Special Requests   Final    BOTTLES DRAWN AEROBIC AND ANAEROBIC Blood Culture results may not be optimal due to an inadequate volume of blood received in culture bottles   Culture   Final    NO GROWTH 1 DAY Performed at [facility], [address], [place], [place] [address]    Report Status PENDING  Incomplete  Resp Panel by RT-PCR (Flu A&B, Covid) Nasopharyngeal Swab     Status: None   Collection Time: 05/19/21 12:25 AM   Specimen: Nasopharyngeal Swab; Nasopharyngeal(NP) swabs in vial transport medium  Result Value Ref Range Status   SARS Coronavirus 2 by RT PCR NEGATIVE NEGATIVE Final    Comment: (NOTE) SARS-CoV-2 target nucleic acids are NOT DETECTED.  The SARS-CoV-2 RNA is generally detectable in upper  respiratory specimens during the acute phase of infection. The lowest concentration of SARS-CoV-2 viral copies this assay can detect is 138 copies/mL. A negative result does not preclude SARS-Cov-2 infection and should not be used as the sole basis for treatment or other patient management decisions. A negative result may occur with  improper specimen collection/handling, submission of specimen other than nasopharyngeal swab, presence of viral mutation(s) within the areas targeted by this assay, and inadequate number of viral copies(<138 copies/mL). A negative result must be combined with clinical observations, patient history, and epidemiological information. The expected result is Negative.  Fact Sheet for Patients:  EntrepreneurPulse.com.au  Fact Sheet for Healthcare Providers:  IncredibleEmployment.be  This test is no t yet approved or cleared by the [place] FDA and  has been authorized for detection and/or diagnosis of SARS-CoV-2 by FDA under an Emergency Use Authorization (EUA). This EUA will remain  in effect (meaning this test can be used) for the duration of the COVID-19 declaration under Section 564(b)(1) of the Act, 21 U.S.C.section 360bbb-3(b)(1), unless the authorization is terminated  or revoked sooner.       Influenza A by PCR NEGATIVE NEGATIVE Final   Influenza B by PCR NEGATIVE NEGATIVE Final    Comment: (NOTE) The Xpert Xpress SARS-CoV-2/FLU/RSV plus assay is intended as an aid in the diagnosis of influenza from Nasopharyngeal swab specimens and should not be used as a sole basis for treatment. Nasal washings and aspirates are unacceptable for Xpert Xpress SARS-CoV-2/FLU/RSV testing.  Fact Sheet for Patients: EntrepreneurPulse.com.au  Fact Sheet for Healthcare Providers: IncredibleEmployment.be  This test is not yet approved or cleared by the [place] FDA and has been  authorized for detection and/or diagnosis of SARS-CoV-2 by FDA under an Emergency Use Authorization (EUA). This EUA will remain in effect (meaning this test can be used) for the duration of the COVID-19 declaration under Section 564(b)(1) of the Act, 21 U.S.C. section 360bbb-3(b)(1), unless the authorization is terminated or revoked.  Performed at [facility], [address] [place], [place] [address]   Urine Culture     Status: None   Collection Time: 05/19/21  3:46 AM   Specimen: Urine, Clean Catch  Result Value Ref Range Status   Specimen Description   Final    URINE, CLEAN CATCH Performed at [facility], [address] [place], [place] [address]    Special  Requests   Final    NONE Performed at [facility], [address], [place], [place] [address]    Culture   Final    NO GROWTH Performed at [facility], [place] [address], [place], [place] [address]    Report Status 05/20/2021 FINAL  Final     Radiology Studies: CT HEAD WO CONTRAST (5MM)  Result Date: 05/19/2021 CLINICAL DATA:  Increasing weakness and falls. EXAM: CT HEAD WITHOUT CONTRAST TECHNIQUE: Contiguous axial images were obtained from the base of the skull through the vertex without intravenous contrast. COMPARISON:  January 10, 2020 FINDINGS: Brain: There is moderate severity cerebral atrophy with widening of the extra-axial spaces and ventricular dilatation. There are areas of decreased attenuation within the white matter tracts of the supratentorial brain, consistent with microvascular disease changes. A chronic right occipital lobe infarct is noted. A punctate calcification is again seen within the central pons. Vascular: No hyperdense vessel or unexpected calcification. Skull: Normal. Negative for fracture or focal lesion. Sinuses/Orbits: No acute finding. Other: None. IMPRESSION: 1. Generalized cerebral atrophy. 2. Chronic right occipital lobe infarct. 3. No acute intracranial  abnormality. Electronically Signed   By: Virgina Norfolk M.D.   On: 05/19/2021 03:06   CT Cervical Spine Wo Contrast  Result Date: 05/19/2021 CLINICAL DATA:  Increasing weakness and falls. EXAM: CT CERVICAL SPINE WITHOUT CONTRAST TECHNIQUE: Multidetector CT imaging of the cervical spine was performed without intravenous contrast. Multiplanar CT image reconstructions were also generated. COMPARISON:  August 02, 2019 FINDINGS: Alignment: Stable, approximately 1 mm retrolisthesis of the C4 vertebral body is noted on C5. Skull base and vertebrae: No acute fracture. No primary bone lesion or focal pathologic process. Soft tissues and spinal canal: No prevertebral fluid or swelling. No visible canal hematoma. Disc levels: Mild to moderate severity endplate sclerosis is seen at the level of C3-C4 with moderate to marked severity endplate sclerosis seen at the levels of C4-C5 and C5-C6. There is marked severity narrowing of the anterior atlantoaxial articulation. Mild intervertebral disc space narrowing is seen at the level of C2-C3. Marked severity intervertebral disc space narrowing is seen at the levels of C4-C5 and C5-C6 and along the posterior aspect of the C3-C4 level. Bilateral moderate to marked severity multilevel facet joint hypertrophy is noted. Upper chest: There is mild biapical scarring and/or atelectasis. Other: None. IMPRESSION: 1. Marked severity multilevel degenerative changes, most prominent at the levels of C4-C5 and C5-C6. 2. No evidence of an acute cervical spine fracture. Electronically Signed   By: Virgina Norfolk M.D.   On: 05/19/2021 03:13   CT ABDOMEN PELVIS W CONTRAST  Result Date: 05/19/2021 CLINICAL DATA:  Weakness and falls. EXAM: CT ABDOMEN AND PELVIS WITH CONTRAST TECHNIQUE: Multidetector CT imaging of the abdomen and pelvis was performed using the standard protocol following bolus administration of intravenous contrast. CONTRAST:  36mL OMNIPAQUE IOHEXOL 300 MG/ML  SOLN  COMPARISON:  November 08, 2019 FINDINGS: Lower chest: Changes consistent with pulmonary fibrosis are again seen within the bilateral lung bases. A very small right pleural effusion is seen. Hepatobiliary: No focal liver abnormality is seen. The gallbladder is not clearly identified. Pancreas: Unremarkable. No pancreatic ductal dilatation or surrounding inflammatory changes. Spleen: Normal in size without focal abnormality. Adrenals/Urinary Tract: Adrenal glands are unremarkable. The right kidney is normal in size. Atrophic changes and cortical thinning are seen along the anterior and lateral aspects of the mid to lower left kidney. 2 mm nonobstructing renal calculi are seen within the posterior aspect  of the mid right kidney and anterolateral aspect of the mid to upper left kidney. Bladder is unremarkable. Stomach/Bowel: Stomach is within normal limits. The appendix is not visualized. No evidence of bowel dilatation. Stool is seen throughout the colon. Noninflamed diverticula are seen throughout the large bowel. Vascular/Lymphatic: Marked severity aortic atherosclerosis. No enlarged abdominal or pelvic lymph nodes. Reproductive: The prostate gland is mildly enlarged. Other: No abdominal wall hernia or abnormality. No abdominopelvic ascites. Musculoskeletal: Multilevel degenerative changes seen throughout the lumbar spine. IMPRESSION: 1. Findings consistent with bibasilar pulmonary fibrosis. 2. Very small right pleural effusion. 3. Bilateral nonobstructing renal calculi. 4. Atrophic changes and cortical thinning of the mid to lower left kidney. 5. Colonic diverticulosis. 6. Marked severity aortic atherosclerosis. Aortic Atherosclerosis (ICD10-I70.0). Electronically Signed   By: Aram Candela M.D.   On: 05/19/2021 03:21   DG Chest Port 1 View  Result Date: 05/19/2021 CLINICAL DATA:  Increasing weakness, multiple falls, confusion EXAM: PORTABLE CHEST 1 VIEW COMPARISON:  08/02/2019 FINDINGS: Single frontal view of  the chest demonstrates an unremarkable cardiac silhouette. Stable bilateral parenchymal scarring and fibrosis, greatest at the bases. No acute airspace disease, effusion, or pneumothorax. No acute bony abnormalities. IMPRESSION: 1. Bilateral parenchymal lung scarring and fibrosis. No acute airspace disease. Electronically Signed   By: Sharlet Salina M.D.   On: 05/19/2021 00:25    Scheduled Meds:  aspirin  81 mg Oral Daily   enoxaparin (LOVENOX) injection  40 mg Subcutaneous Q24H   ezetimibe  10 mg Oral Daily   hydrocerin   Topical Daily   mirtazapine  15 mg Oral QHS   pantoprazole  40 mg Oral Daily   sertraline  50 mg Oral Daily   Continuous Infusions:   LOS: 1 day   Rickey Barbara, MD [facility] Pager On Amion  If 7PM-7AM, please contact night-coverage 05/20/2021, 3:41 PM

## 2021-05-21 LAB — COMPREHENSIVE METABOLIC PANEL
ALT: 130 U/L — ABNORMAL HIGH (ref 0–44)
AST: 175 U/L — ABNORMAL HIGH (ref 15–41)
Albumin: 3 g/dL — ABNORMAL LOW (ref 3.5–5.0)
Alkaline Phosphatase: 92 U/L (ref 38–126)
Anion gap: 8 (ref 5–15)
BUN: 31 mg/dL — ABNORMAL HIGH (ref 8–23)
CO2: 23 mmol/L (ref 22–32)
Calcium: 8.5 mg/dL — ABNORMAL LOW (ref 8.9–10.3)
Chloride: 104 mmol/L (ref 98–111)
Creatinine, Ser: 1.65 mg/dL — ABNORMAL HIGH (ref 0.61–1.24)
GFR, Estimated: 41 mL/min — ABNORMAL LOW (ref >60)
Glucose, Bld: 66 mg/dL — ABNORMAL LOW (ref 70–99)
Potassium: 4.9 mmol/L (ref 3.5–5.1)
Sodium: 135 mmol/L (ref 135–145)
Total Bilirubin: 0.9 mg/dL (ref 0.3–1.2)
Total Protein: 6 g/dL — ABNORMAL LOW (ref 6.5–8.1)

## 2021-05-21 LAB — GLUCOSE, CAPILLARY: Glucose-Capillary: 56 mg/dL — ABNORMAL LOW (ref 70–99)

## 2021-05-21 MED ORDER — DEXTROSE-NACL 5-0.9 % IV SOLN: INTRAVENOUS | Status: DC

## 2021-05-21 MED ORDER — ENOXAPARIN SODIUM 30 MG/0.3ML IJ SOSY
30.0000 mg | PREFILLED_SYRINGE | INTRAMUSCULAR | Status: DC
  Administered 2021-05-21 – 2021-05-22 (×2): 30 mg via SUBCUTANEOUS
  Filled 2021-05-21 (×2): qty 0.3

## 2021-05-21 NOTE — Progress Notes (Signed)
PROGRESS NOTE    Frank Khan  [id number] DOB: October 29, 1934 DOA: 05/19/2021 PCP: Rusty Aus, MD    Brief Narrative:  87 y.o. male with medical history significant of dementia, pulmonary fibrosis on 2 L oxygen, CAD, stent placement, CKD-3A, hypertension, hyperlipidemia, diet-controlled diabetes, TIA, GERD, depression, anxiety, who presents with weakness, altered mental status and fall. Pt was admitted for w/u of SIRS criteria with lactate  Assessment & Plan:   Principal Problem:   SIRS (systemic inflammatory response syndrome) (HCC) Active Problems:   TIA (transient ischemic attack)   Pulmonary fibrosis (HCC)   Hypertension   Type II diabetes mellitus with renal manifestations (HCC)   CKD (chronic kidney disease), stage IIIa   HLD (hyperlipidemia)   Depression with anxiety   CAD (coronary artery disease)   Sacral ulcer (HCC)   Acute metabolic encephalopathy   Fall at home, initial encounter  SIRS (systemic inflammatory response syndrome) ([place]):  -Patient meets criteria for SIRS with hypothermia with temperature 96.3, tachycardia with heart rate 105, tachypnea with RR 23.  Lactic acid is elevated 3.1 which is normalized with IV fluid treatment.   -Thus far, no source of infection identified.  Urinalysis negative.  Chest x-ray did not show infiltration.  COVID PCR negative.   -presenting procalcitonin is < 0.10.  -Will follow-up blood culture and urine culture. -Clinically appears dehydrated per below.    Fall at home, initial encounter: Imaging negative for acute injury.  CT head negative. -Fall precaution -PT/OT consulted with recs for SNF -TOC consulted   TIA (transient ischemic attack): -Cont Aspirin, Zetia   HLD: -Zetia   Pulmonary fibrosis (HCC) -Currently on 3LNC, baseline 2LNC -On bronchodilators   Hypertension:  -Cont IV hydralazine as needed -BP soft   Type II diabetes mellitus with renal manifestations ([place]):  -Recent A1c 5.8.  Well-controlled.   Patient not taking medications currently. -Follow random glucose per basic labs   CKD (chronic kidney disease), stage IIIa: Slightly worsening.  Recent baseline creatinine 1.1 on 12/30/2020.  His creatinine is 1.29, BUN 23. -IV fluid as above -Hold ibuprofen   Depression with anxiety -Continue home medications   CAD (coronary artery disease) -Aspirin, Zetia   Acute metabolic encephalopathy:  -Likely multifactorial etiology including SIRS, worsening renal function.  No focal neuro deficit on physical examination.   -CT head with generalized cerebral atrophy, chronic R occipital lobe infarct  Moisture associated skin injury ruled in, Sacral ulcer ruled out [facility]):  -appreciate input by WOC  Dehydration -Pt with dry mucus membranes, appears clinically dehydrated -Observed with meal tray in front of pt however pt not eating -Encourage PO as tolerated -consult dietitian -Will cont on IVF hydration as tolerated    DVT prophylaxis: Lovenox subq Code Status: DNR Family Communication: Pt in room, family not at bedside  Status is: Inpatient  Remains inpatient appropriate because: Severity of illness and disposition issues   Consultants:    Procedures:    Antimicrobials: Anti-infectives (From admission, onward)    Start     Dose/Rate Route Frequency Ordered Stop   05/19/21 0230  ceFEPIme (MAXIPIME) 2 g in sodium chloride 0.9 % 100 mL IVPB        2 g 200 mL/hr over 30 Minutes Intravenous  Once 05/19/21 0226 05/19/21 0329   05/19/21 0230  metroNIDAZOLE (FLAGYL) IVPB 500 mg        500 mg 100 mL/hr over 60 Minutes Intravenous  Once 05/19/21 0226 05/19/21 0501   05/19/21 0230  vancomycin (VANCOCIN) IVPB  1000 mg/200 mL premix        1,000 mg 200 mL/hr over 60 Minutes Intravenous  Once 05/19/21 0226 05/19/21 0658       Subjective: Confused this AM  Objective: Vitals:   05/20/21 1601 05/20/21 2030 05/21/21 0332 05/21/21 0807  BP: 106/62 125/73 105/74 120/71  Pulse: 92 95  88 89  Resp: 16 15 16 16   Temp: 97.6 F (36.4 C) 97.9 F (36.6 C) 98.8 F (37.1 C) (!) 97.4 F (36.3 C)  TempSrc:      SpO2: 100% 100% 100% 99%  Weight:      Height:        Intake/Output Summary (Last 24 hours) at 05/21/2021 1640 Last data filed at 05/21/2021 [id number] Gross per 24 hour  Intake 844.44 ml  Output 200 ml  Net 644.44 ml    Filed Weights   05/18/21 2354  Weight: 54.4 kg    Examination: General exam: Tired appearing, in no acute distress Respiratory system: normal chest rise, clear, no audible wheezing Cardiovascular system: regular rhythm, s1-s2 Gastrointestinal system: Nondistended, nontender, pos BS Central nervous system: No seizures, no tremors Extremities: No cyanosis, no joint deformities Skin: No rashes, no pallor Psychiatry: Unable to assess given mentation  Data Reviewed: I have personally reviewed following labs and imaging studies  CBC: Recent Labs  Lab 05/19/21 0025 05/20/21 0429  WBC 9.4 7.3  NEUTROABS 8.3*  --   HGB 14.4 13.5  HCT 44.0 41.5  MCV 95.4 99.0  PLT 158 146*    Basic Metabolic Panel: Recent Labs  Lab 05/19/21 0025 05/20/21 0429 05/21/21 0501  NA 137 137 135  K 4.1 4.7 4.9  CL 105 105 104  CO2 24 27 23   GLUCOSE 198* 70 66*  BUN 23 21 31*  CREATININE 1.29* 1.32* 1.65*  CALCIUM 8.7* 8.7* 8.5*    GFR: Estimated Creatinine Clearance: 25.6 mL/min (A) (by C-G formula based on SCr of 1.65 mg/dL (H)). Liver Function Tests: Recent Labs  Lab 05/19/21 0025 05/21/21 0501  AST 35 175*  ALT 18 130*  ALKPHOS 94 92  BILITOT 0.9 0.9  PROT 6.7 6.0*  ALBUMIN 3.5 3.0*    Recent Labs  Lab 05/19/21 0025  LIPASE 52*    No results for input(s): AMMONIA in the last 168 hours. Coagulation Profile: Recent Labs  Lab 05/19/21 0025  INR 1.0    Cardiac Enzymes: No results for input(s): CKTOTAL, CKMB, CKMBINDEX, TROPONINI in the last 168 hours. BNP (last 3 results) No results for input(s): PROBNP in the last 8760  hours. HbA1C: No results for input(s): HGBA1C in the last 72 hours. CBG: Recent Labs  Lab 05/20/21 0742 05/21/21 1019  GLUCAP 85 56*    Lipid Profile: No results for input(s): CHOL, HDL, LDLCALC, TRIG, CHOLHDL, LDLDIRECT in the last 72 hours. Thyroid Function Tests: No results for input(s): TSH, T4TOTAL, FREET4, T3FREE, THYROIDAB in the last 72 hours. Anemia Panel: No results for input(s): VITAMINB12, FOLATE, FERRITIN, TIBC, IRON, RETICCTPCT in the last 72 hours. Sepsis Labs: Recent Labs  Lab 05/19/21 0025 05/19/21 0610  PROCALCITON <0.10  --   LATICACIDVEN 3.1* 1.0     Recent Results (from the past 240 hour(s))  Culture, blood (Routine x 2)     Status: None (Preliminary result)   Collection Time: 05/19/21 12:25 AM   Specimen: BLOOD  Result Value Ref Range Status   Specimen Description BLOOD RIGHT FOREARM  Final   Special Requests   Final    BOTTLES  DRAWN AEROBIC AND ANAEROBIC Blood Culture results may not be optimal due to an inadequate volume of blood received in culture bottles   Culture   Final    NO GROWTH 2 DAYS Performed at [facility], [address] [place], [place] [address]    Report Status PENDING  Incomplete  Culture, blood (Routine x 2)     Status: None (Preliminary result)   Collection Time: 05/19/21 12:25 AM   Specimen: BLOOD  Result Value Ref Range Status   Specimen Description BLOOD RIGHT FOREARM  Final   Special Requests   Final    BOTTLES DRAWN AEROBIC AND ANAEROBIC Blood Culture results may not be optimal due to an inadequate volume of blood received in culture bottles   Culture   Final    NO GROWTH 2 DAYS Performed at [facility], [address] [place], [place] [address]    Report Status PENDING  Incomplete  Resp Panel by RT-PCR (Flu A&B, Covid) Nasopharyngeal Swab     Status: None   Collection Time: 05/19/21 12:25 AM   Specimen: Nasopharyngeal Swab; Nasopharyngeal(NP) swabs in vial transport medium  Result Value  Ref Range Status   SARS Coronavirus 2 by RT PCR NEGATIVE NEGATIVE Final    Comment: (NOTE) SARS-CoV-2 target nucleic acids are NOT DETECTED.  The SARS-CoV-2 RNA is generally detectable in upper respiratory specimens during the acute phase of infection. The lowest concentration of SARS-CoV-2 viral copies this assay can detect is 138 copies/mL. A negative result does not preclude SARS-Cov-2 infection and should not be used as the sole basis for treatment or other patient management decisions. A negative result may occur with  improper specimen collection/handling, submission of specimen other than nasopharyngeal swab, presence of viral mutation(s) within the areas targeted by this assay, and inadequate number of viral copies(<138 copies/mL). A negative result must be combined with clinical observations, patient history, and epidemiological information. The expected result is Negative.  Fact Sheet for Patients:  BloggerCourse.comhttps://www.fda.gov/media/152166/download  Fact Sheet for Healthcare Providers:  SeriousBroker.ithttps://www.fda.gov/media/152162/download  This test is no t yet approved or cleared by the [place] FDA and  has been authorized for detection and/or diagnosis of SARS-CoV-2 by FDA under an Emergency Use Authorization (EUA). This EUA will remain  in effect (meaning this test can be used) for the duration of the COVID-19 declaration under Section 564(b)(1) of the Act, 21 U.S.C.section 360bbb-3(b)(1), unless the authorization is terminated  or revoked sooner.       Influenza A by PCR NEGATIVE NEGATIVE Final   Influenza B by PCR NEGATIVE NEGATIVE Final    Comment: (NOTE) The Xpert Xpress SARS-CoV-2/FLU/RSV plus assay is intended as an aid in the diagnosis of influenza from Nasopharyngeal swab specimens and should not be used as a sole basis for treatment. Nasal washings and aspirates are unacceptable for Xpert Xpress SARS-CoV-2/FLU/RSV testing.  Fact Sheet for  Patients: BloggerCourse.comhttps://www.fda.gov/media/152166/download  Fact Sheet for Healthcare Providers: SeriousBroker.ithttps://www.fda.gov/media/152162/download  This test is not yet approved or cleared by the [place] FDA and has been authorized for detection and/or diagnosis of SARS-CoV-2 by FDA under an Emergency Use Authorization (EUA). This EUA will remain in effect (meaning this test can be used) for the duration of the COVID-19 declaration under Section 564(b)(1) of the Act, 21 U.S.C. section 360bbb-3(b)(1), unless the authorization is terminated or revoked.  Performed at [facility], [address] [place], [place] [address]   Urine Culture     Status: None   Collection Time: 05/19/21  3:46 AM   Specimen: Urine, Clean Catch  Result Value Ref Range Status   Specimen Description   Final    URINE, CLEAN CATCH Performed at [facility], [address], [place], [place] [address]    Special Requests   Final    NONE Performed at [facility], [address], [place], [place] [address]    Culture   Final    NO GROWTH Performed at [facility], [place] [address], [place],  [address]    Report Status 05/20/2021 FINAL  Final      Radiology Studies: No results found.  Scheduled Meds:  aspirin  81 mg Oral Daily   enoxaparin (LOVENOX) injection  30 mg Subcutaneous Q24H   ezetimibe  10 mg Oral Daily   hydrocerin   Topical Daily   mirtazapine  15 mg Oral QHS   pantoprazole  40 mg Oral Daily   sertraline  50 mg Oral Daily   Continuous Infusions:  sodium chloride 100 mL/hr at 05/21/21 1635     LOS: 2 days   Marylu Lund, MD [facility] Pager On Amion  If 7PM-7AM, please contact night-coverage 05/21/2021, 4:40 PM

## 2021-05-21 NOTE — TOC CM/SW Note (Signed)
Per chart patient is disoriented. Attempted call to son Kathlene November to discuss recommendation for SNF. Left a VM requesting a return call.  Alfonso Ramus, [place] [phone]

## 2021-05-21 NOTE — Progress Notes (Signed)
PHARMACIST - PHYSICIAN COMMUNICATION  CONCERNING:  Enoxaparin (Lovenox) for DVT Prophylaxis    RECOMMENDATION: Patient was prescribed enoxaprin 40mg  q24 hours for VTE prophylaxis.   Filed Weights   05/18/21 2354  Weight: 54.4 kg (120 lb)    Body mass index is 16.27 kg/m.  Estimated Creatinine Clearance: 25.6 mL/min (A) (by C-G formula based on SCr of 1.65 mg/dL (H)).  Patient is candidate for enoxaparin 30mg  every 24 hours based on CrCl <29ml/min   DESCRIPTION: Pharmacy has adjusted enoxaparin dose per [facility] policy.  Patient is now receiving enoxaparin 30 mg every 24 hours    31m, PharmD Clinical Pharmacist  05/21/2021 7:17 AM

## 2021-05-22 LAB — GLUCOSE, CAPILLARY
Glucose-Capillary: 135 mg/dL — ABNORMAL HIGH (ref 70–99)
Glucose-Capillary: 220 mg/dL — ABNORMAL HIGH (ref 70–99)
Glucose-Capillary: 95 mg/dL (ref 70–99)
Glucose-Capillary: 106 mg/dL — ABNORMAL HIGH (ref 70–99)

## 2021-05-22 MED ORDER — INSULIN ASPART 100 UNIT/ML IJ SOLN
0.0000 [IU] | Freq: Three times a day (TID) | INTRAMUSCULAR | Status: DC
  Administered 2021-05-22: 3 [IU] via SUBCUTANEOUS
  Administered 2021-05-23 (×3): 2 [IU] via SUBCUTANEOUS
  Filled 2021-05-22 (×4): qty 1

## 2021-05-22 MED ORDER — INSULIN ASPART 100 UNIT/ML IJ SOLN: 0.0000 [IU] | Freq: Every day | INTRAMUSCULAR | Status: DC

## 2021-05-22 NOTE — NC FL2 (Signed)
[place] LEVEL OF CARE SCREENING TOOL     IDENTIFICATION  Patient Name: Frank Khan Birthdate: 1934/09/03 Sex: male Admission Date (Current Location): 05/19/2021  [place] and [place] Number:  Engineering geologist and Address:  [facility], [address] [place], [place] [address]      Provider Number: [id number]  Attending Physician Name and Address:  Donne Hazel, MD  Relative Name and Phone Number:  Roel Cluck [phone]    Current Level of Care: Hospital Recommended Level of Care: [place] Prior Approval Number:    Date Approved/Denied:   PASRR Number: [id number]  Discharge Plan: SNF    Current Diagnoses: Patient Active Problem List   Diagnosis Date Noted   SIRS (systemic inflammatory response syndrome) ([place]) 05/19/2021   Sacral ulcer ([place]) [id number]   Acute metabolic encephalopathy [id number]   Fall at home, initial encounter 05/19/2021   Generalized weakness    Acute cholecystitis due to biliary calculus 11/08/2019   TIA (transient ischemic attack)    Pulmonary fibrosis ([place])    Hypertension    Type II diabetes mellitus with renal manifestations ([place])    Difficulty speaking    Hypoglycemia    COVID-19    CKD (chronic kidney disease), stage IIIa    HLD (hyperlipidemia)    GERD (gastroesophageal reflux disease)    Depression with anxiety    CAD (coronary artery disease)     Orientation RESPIRATION BLADDER Height & Weight     Self, Place, Time  O2 (2 liters) Incontinent Weight: 54.4 kg Height:  6' (182.9 cm)  BEHAVIORAL SYMPTOMS/MOOD NEUROLOGICAL BOWEL NUTRITION STATUS      Continent Diet (regular)  AMBULATORY STATUS COMMUNICATION OF NEEDS Skin   Extensive Assist Verbally Normal                       Personal Care Assistance Level of Assistance  Bathing, Feeding, Dressing Bathing Assistance: Limited assistance   Dressing Assistance: Limited assistance     Functional  Limitations Info  Hearing   Hearing Info: Impaired      SPECIAL CARE FACTORS FREQUENCY  PT (By licensed PT), OT (By licensed OT)     PT Frequency: 5 times per week OT Frequency: 5 times per week            Contractures Contractures Info: Not present    Additional Factors Info  Code Status, Allergies Code Status Info: DNR Allergies Info: Doxycycline, Penicillins, Simvastatin, Chlorhexidine, Iodine           Current Medications (05/22/2021):  This is the current hospital active medication list Current Facility-Administered Medications  Medication Dose Route Frequency Provider Last Rate Last Admin   acetaminophen (TYLENOL) tablet 650 mg  650 mg Oral Q6H PRN Ivor Costa, MD       albuterol (PROVENTIL) (2.5 MG/3ML) 0.083% nebulizer solution 2.5 mg  2.5 mg Nebulization Q4H PRN Ivor Costa, MD       aspirin chewable tablet 81 mg  81 mg Oral Daily Ivor Costa, MD   81 mg at 05/22/21 0916   dextromethorphan-guaiFENesin (MUCINEX DM) 30-600 MG per 12 hr tablet 1 tablet  1 tablet Oral BID PRN Ivor Costa, MD   1 tablet at 05/22/21 0917   dextrose 5 %-0.9 % sodium chloride infusion   Intravenous Continuous Donne Hazel, MD 100 mL/hr at 05/22/21 0450 Infusion Verify at 05/22/21 0450   enoxaparin (LOVENOX) injection 30 mg  30 mg Subcutaneous Q24H  Dallie Piles, RPH   30 mg at 05/21/21 2032   ezetimibe (ZETIA) tablet 10 mg  10 mg Oral Daily Ivor Costa, MD   10 mg at 05/22/21 [id number]   hydrALAZINE (APRESOLINE) injection 5 mg  5 mg Intravenous Q2H PRN Ivor Costa, MD       hydrocerin (EUCERIN) cream   Topical Daily Ivor Costa, MD   Given at 05/21/21 1018   hydrOXYzine (ATARAX) tablet 25 mg  25 mg Oral BID PRN Ivor Costa, MD       insulin aspart (novoLOG) injection 0-5 Units  0-5 Units Subcutaneous QHS Donne Hazel, MD       insulin aspart (novoLOG) injection 0-9 Units  0-9 Units Subcutaneous TID WC Donne Hazel, MD       mirtazapine (REMERON) tablet 15 mg  15 mg Oral QHS Ivor Costa, MD   15  mg at 05/21/21 2031   oxyCODONE-acetaminophen (PERCOCET/ROXICET) 5-325 MG per tablet 1 tablet  1 tablet Oral Q4H PRN Ivor Costa, MD   1 tablet at 05/22/21 0245   pantoprazole (PROTONIX) EC tablet 40 mg  40 mg Oral Daily Ivor Costa, MD   40 mg at 05/22/21 0916   sertraline (ZOLOFT) tablet 50 mg  50 mg Oral Daily Ivor Costa, MD   50 mg at 05/22/21 [id number]     Discharge Medications: Please see discharge summary for a list of discharge medications.  Relevant Imaging Results:  Relevant Lab Results:   Additional Information SS# [id number]  Conception Oms, RN

## 2021-05-22 NOTE — Progress Notes (Signed)
PROGRESS NOTE    Frank Khan  KGY:[id number] DOB: 09/25/1934 DOA: 05/19/2021 PCP: Danella Penton, MD    Brief Narrative:  87 y.o. male with medical history significant of dementia, pulmonary fibrosis on 2 L oxygen, CAD, stent placement, CKD-3A, hypertension, hyperlipidemia, diet-controlled diabetes, TIA, GERD, depression, anxiety, who presents with weakness, altered mental status and fall. Pt was admitted for w/u of SIRS criteria with lactate  Assessment & Plan:   Principal Problem:   SIRS (systemic inflammatory response syndrome) (HCC) Active Problems:   TIA (transient ischemic attack)   Pulmonary fibrosis (HCC)   Hypertension   Type II diabetes mellitus with renal manifestations (HCC)   CKD (chronic kidney disease), stage IIIa   HLD (hyperlipidemia)   Depression with anxiety   CAD (coronary artery disease)   Sacral ulcer (HCC)   Acute metabolic encephalopathy   Fall at home, initial encounter  SIRS (systemic inflammatory response syndrome) (HCC):  -Patient meets criteria for SIRS with hypothermia with temperature 96.3, tachycardia with heart rate 105, tachypnea with RR 23.  Lactic acid is elevated 3.1 which is normalized with IV fluid treatment.   -Thus far, no source of infection identified.  Urinalysis negative.  Chest x-ray did not show infiltration.  COVID PCR negative.   -presenting procalcitonin is < 0.10.  -Urine and blood cx neg -Cont off abx -Recently appeared dehydrated, cont on IVF   Fall at home, initial encounter:  -Imaging negative for acute injury.  CT head negative. -Fall precaution -PT/OT consulted with recs for SNF -TOC following   TIA (transient ischemic attack): -Cont Aspirin, Zetia   HLD: -Zetia   Pulmonary fibrosis (HCC) -Now on 2LNC, baseline 2LNC -On bronchodilators as needed   Hypertension:  -Cont IV hydralazine as needed -BP soft   Type II diabetes mellitus with renal manifestations (HCC):  -Recent A1c 5.8.  Well-controlled.   Patient not taking medications currently. -Follow random glucose per basic labs   CKD (chronic kidney disease), stage IIIa: Slightly worsening.  Recent baseline creatinine 1.1 on 12/30/2020.  Most recent creatinine is 1.65 -IV fluid as above -Hold ibuprofen   Depression with anxiety -Continue home medications   CAD (coronary artery disease) -Aspirin, Zetia   Acute metabolic encephalopathy:  -Likely multifactorial etiology including SIRS, worsening renal function.  No focal neuro deficit on physical examination.   -CT head with generalized cerebral atrophy, chronic R occipital lobe infarct  Moisture associated skin injury ruled in, Sacral ulcer ruled out [facility]):  -appreciate input by WOC  Dehydration -Pt with dry mucus membranes, appears clinically dehydrated -Observed with meal tray in front of pt however pt not eating -Encourage PO as tolerated -dietitan was consulted -Will cont on IVF hydration as tolerated -Recheck bmet in AM    DVT prophylaxis: Lovenox subq Code Status: DNR Family Communication: Pt in room, family not at bedside  Status is: Inpatient  Remains inpatient appropriate because: Severity of illness and disposition issues   Consultants:    Procedures:    Antimicrobials: Anti-infectives (From admission, onward)    Start     Dose/Rate Route Frequency Ordered Stop   05/19/21 0230  ceFEPIme (MAXIPIME) 2 g in sodium chloride 0.9 % 100 mL IVPB        2 g 200 mL/hr over 30 Minutes Intravenous  Once 05/19/21 0226 05/19/21 0329   05/19/21 0230  metroNIDAZOLE (FLAGYL) IVPB 500 mg        500 mg 100 mL/hr over 60 Minutes Intravenous  Once 05/19/21 0226 05/19/21 0501  05/19/21 0230  vancomycin (VANCOCIN) IVPB 1000 mg/200 mL premix        1,000 mg 200 mL/hr over 60 Minutes Intravenous  Once 05/19/21 0226 05/19/21 0658       Subjective: More awake, still confused  Objective: Vitals:   05/21/21 1937 05/22/21 0356 05/22/21 0759 05/22/21 1533  BP: 118/72  119/76 124/72 102/63  Pulse: 88 90 86 90  Resp: 19 17 14 15   Temp: 97.6 F (36.4 C) 98 F (36.7 C) 98 F (36.7 C) 97.9 F (36.6 C)  TempSrc:      SpO2: 100% 98% 100% 95%  Weight:      Height:        Intake/Output Summary (Last 24 hours) at 05/22/2021 1714 Last data filed at 05/22/2021 1014 Gross per 24 hour  Intake 243.61 ml  Output 1299 ml  Net -1055.39 ml    Filed Weights   05/18/21 2354  Weight: 54.4 kg    Examination: General exam: Awake, laying in bed, in nad Respiratory system: Normal respiratory effort, no wheezing Cardiovascular system: regular rate, s1, s2 Gastrointestinal system: Soft, nondistended, positive BS Central nervous system: CN2-12 grossly intact, strength intact Extremities: Perfused, no clubbing Skin: Normal skin turgor, no notable skin lesions seen Psychiatry: Unable to assess given mentation  Data Reviewed: I have personally reviewed following labs and imaging studies  CBC: Recent Labs  Lab 05/19/21 0025 05/20/21 0429  WBC 9.4 7.3  NEUTROABS 8.3*  --   HGB 14.4 13.5  HCT 44.0 41.5  MCV 95.4 99.0  PLT 158 146*    Basic Metabolic Panel: Recent Labs  Lab 05/19/21 0025 05/20/21 0429 05/21/21 0501  NA 137 137 135  K 4.1 4.7 4.9  CL 105 105 104  CO2 24 27 23   GLUCOSE 198* 70 66*  BUN 23 21 31*  CREATININE 1.29* 1.32* 1.65*  CALCIUM 8.7* 8.7* 8.5*    GFR: Estimated Creatinine Clearance: 25.6 mL/min (A) (by C-G formula based on SCr of 1.65 mg/dL (H)). Liver Function Tests: Recent Labs  Lab 05/19/21 0025 05/21/21 0501  AST 35 175*  ALT 18 130*  ALKPHOS 94 92  BILITOT 0.9 0.9  PROT 6.7 6.0*  ALBUMIN 3.5 3.0*    Recent Labs  Lab 05/19/21 0025  LIPASE 52*    No results for input(s): AMMONIA in the last 168 hours. Coagulation Profile: Recent Labs  Lab 05/19/21 0025  INR 1.0    Cardiac Enzymes: No results for input(s): CKTOTAL, CKMB, CKMBINDEX, TROPONINI in the last 168 hours. BNP (last 3 results) No results for  input(s): PROBNP in the last 8760 hours. HbA1C: No results for input(s): HGBA1C in the last 72 hours. CBG: Recent Labs  Lab 05/20/21 0742 05/21/21 1019 05/22/21 0754 05/22/21 1236 05/22/21 1641  GLUCAP 85 56* 135* 220* 95    Lipid Profile: No results for input(s): CHOL, HDL, LDLCALC, TRIG, CHOLHDL, LDLDIRECT in the last 72 hours. Thyroid Function Tests: No results for input(s): TSH, T4TOTAL, FREET4, T3FREE, THYROIDAB in the last 72 hours. Anemia Panel: No results for input(s): VITAMINB12, FOLATE, FERRITIN, TIBC, IRON, RETICCTPCT in the last 72 hours. Sepsis Labs: Recent Labs  Lab 05/19/21 0025 05/19/21 0610  PROCALCITON <0.10  --   LATICACIDVEN 3.1* 1.0     Recent Results (from the past 240 hour(s))  Culture, blood (Routine x 2)     Status: None (Preliminary result)   Collection Time: 05/19/21 12:25 AM   Specimen: BLOOD  Result Value Ref Range Status   Specimen  Description BLOOD RIGHT FOREARM  Final   Special Requests   Final    BOTTLES DRAWN AEROBIC AND ANAEROBIC Blood Culture results may not be optimal due to an inadequate volume of blood received in culture bottles   Culture   Final    NO GROWTH 2 DAYS Performed at [facility], [address] [place], [place] [address]    Report Status PENDING  Incomplete  Culture, blood (Routine x 2)     Status: None (Preliminary result)   Collection Time: 05/19/21 12:25 AM   Specimen: BLOOD  Result Value Ref Range Status   Specimen Description BLOOD RIGHT FOREARM  Final   Special Requests   Final    BOTTLES DRAWN AEROBIC AND ANAEROBIC Blood Culture results may not be optimal due to an inadequate volume of blood received in culture bottles   Culture   Final    NO GROWTH 2 DAYS Performed at [facility], [address], [place], [place] [address]    Report Status PENDING  Incomplete  Resp Panel by RT-PCR (Flu A&B, Covid) Nasopharyngeal Swab     Status: None   Collection Time: 05/19/21 12:25 AM    Specimen: Nasopharyngeal Swab; Nasopharyngeal(NP) swabs in vial transport medium  Result Value Ref Range Status   SARS Coronavirus 2 by RT PCR NEGATIVE NEGATIVE Final    Comment: (NOTE) SARS-CoV-2 target nucleic acids are NOT DETECTED.  The SARS-CoV-2 RNA is generally detectable in upper respiratory specimens during the acute phase of infection. The lowest concentration of SARS-CoV-2 viral copies this assay can detect is 138 copies/mL. A negative result does not preclude SARS-Cov-2 infection and should not be used as the sole basis for treatment or other patient management decisions. A negative result may occur with  improper specimen collection/handling, submission of specimen other than nasopharyngeal swab, presence of viral mutation(s) within the areas targeted by this assay, and inadequate number of viral copies(<138 copies/mL). A negative result must be combined with clinical observations, patient history, and epidemiological information. The expected result is Negative.  Fact Sheet for Patients:  EntrepreneurPulse.com.au  Fact Sheet for Healthcare Providers:  IncredibleEmployment.be  This test is no t yet approved or cleared by the [place] FDA and  has been authorized for detection and/or diagnosis of SARS-CoV-2 by FDA under an Emergency Use Authorization (EUA). This EUA will remain  in effect (meaning this test can be used) for the duration of the COVID-19 declaration under Section 564(b)(1) of the Act, 21 U.S.C.section 360bbb-3(b)(1), unless the authorization is terminated  or revoked sooner.       Influenza A by PCR NEGATIVE NEGATIVE Final   Influenza B by PCR NEGATIVE NEGATIVE Final    Comment: (NOTE) The Xpert Xpress SARS-CoV-2/FLU/RSV plus assay is intended as an aid in the diagnosis of influenza from Nasopharyngeal swab specimens and should not be used as a sole basis for treatment. Nasal washings and aspirates are  unacceptable for Xpert Xpress SARS-CoV-2/FLU/RSV testing.  Fact Sheet for Patients: EntrepreneurPulse.com.au  Fact Sheet for Healthcare Providers: IncredibleEmployment.be  This test is not yet approved or cleared by the [place] FDA and has been authorized for detection and/or diagnosis of SARS-CoV-2 by FDA under an Emergency Use Authorization (EUA). This EUA will remain in effect (meaning this test can be used) for the duration of the COVID-19 declaration under Section 564(b)(1) of the Act, 21 U.S.C. section 360bbb-3(b)(1), unless the authorization is terminated or revoked.  Performed at [facility], [address] [place],  [place] [address]   Urine Culture     Status: None   Collection Time: 05/19/21  3:46 AM   Specimen: Urine, Clean Catch  Result Value Ref Range Status   Specimen Description   Final    URINE, CLEAN CATCH Performed at [facility], [address] [place], [place] [address]    Special Requests   Final    NONE Performed at [facility], [address] [place], [place] [address]    Culture   Final    NO GROWTH Performed at [facility], [place] [address] [place], [place] [address]    Report Status 05/20/2021 FINAL  Final      Radiology Studies: No results found.  Scheduled Meds:  aspirin  81 mg Oral Daily   enoxaparin (LOVENOX) injection  30 mg Subcutaneous Q24H   ezetimibe  10 mg Oral Daily   hydrocerin   Topical Daily   insulin aspart  0-5 Units Subcutaneous QHS   insulin aspart  0-9 Units Subcutaneous TID WC   mirtazapine  15 mg Oral QHS   pantoprazole  40 mg Oral Daily   sertraline  50 mg Oral Daily   Continuous Infusions:  dextrose 5 % and 0.9% NaCl 100 mL/hr at 05/22/21 1528     LOS: 3 days   Marylu Lund, MD [facility] Pager On Amion  If 7PM-7AM, please contact night-coverage 05/22/2021, 5:14 PM

## 2021-05-22 NOTE — TOC Progression Note (Addendum)
Transition of Care [facility]) - Progression Note    Patient Details  Name: Frank Khan MRN: [id number] Date of Birth: 03-19-1935  Transition of Care [facility]) CM/SW [place], RN Phone Number: 05/22/2021, 9:31 AM  Clinical Narrative:   Spoke with the patient's Son Ronalee Belts and daughter in law, They are agreeable to a bedsearch to go to [facility], the patient lives at home, his grand daughter has been staying with him, he gets 3 hours a day of nursing from the [place] The Son has POA and is bringing the paper work to the hospital to place on the chart Bedsearch sent, PASSR obtained, FL2 completed, will review bed option once obtained         Expected Discharge Plan and Services                                                 Social Determinants of Health (SDOH) Interventions    Readmission Risk Interventions No flowsheet data found.

## 2021-05-23 ENCOUNTER — Inpatient Hospital Stay (HOSPITAL_COMMUNITY)
Admit: 2021-05-23 | Discharge: 2021-05-23 | Disposition: A | Discharge status: Home / Self Care | Payer: No Typology Code available for payment source | Attending: Internal Medicine | Admitting: Internal Medicine

## 2021-05-23 ENCOUNTER — Inpatient Hospital Stay: Payer: No Typology Code available for payment source

## 2021-05-23 DIAGNOSIS — I5031 Acute diastolic (congestive) heart failure: Secondary | ICD-10-CM

## 2021-05-23 LAB — COMPREHENSIVE METABOLIC PANEL
ALT: 75 U/L — ABNORMAL HIGH (ref 0–44)
AST: 69 U/L — ABNORMAL HIGH (ref 15–41)
Albumin: 2.7 g/dL — ABNORMAL LOW (ref 3.5–5.0)
Alkaline Phosphatase: 87 U/L (ref 38–126)
Anion gap: 3 — ABNORMAL LOW (ref 5–15)
BUN: 21 mg/dL (ref 8–23)
CO2: 23 mmol/L (ref 22–32)
Calcium: 7.9 mg/dL — ABNORMAL LOW (ref 8.9–10.3)
Chloride: 110 mmol/L (ref 98–111)
Creatinine, Ser: 1.2 mg/dL (ref 0.61–1.24)
GFR, Estimated: 60 mL/min — ABNORMAL LOW (ref >60)
Glucose, Bld: 137 mg/dL — ABNORMAL HIGH (ref 70–99)
Potassium: 4.2 mmol/L (ref 3.5–5.1)
Sodium: 136 mmol/L (ref 135–145)
Total Bilirubin: 0.9 mg/dL (ref 0.3–1.2)
Total Protein: 5.3 g/dL — ABNORMAL LOW (ref 6.5–8.1)

## 2021-05-23 LAB — GLUCOSE, CAPILLARY
Glucose-Capillary: 192 mg/dL — ABNORMAL HIGH (ref 70–99)
Glucose-Capillary: 188 mg/dL — ABNORMAL HIGH (ref 70–99)
Glucose-Capillary: 131 mg/dL — ABNORMAL HIGH (ref 70–99)
Glucose-Capillary: 88 mg/dL (ref 70–99)

## 2021-05-23 MED ORDER — LEVOFLOXACIN IN D5W 750 MG/150ML IV SOLN
750.0000 mg | INTRAVENOUS | Status: DC
  Filled 2021-05-23: qty 150

## 2021-05-23 MED ORDER — ENSURE ENLIVE PO LIQD
237.0000 mL | Freq: Three times a day (TID) | ORAL | Status: DC
  Administered 2021-05-23 – 2021-05-26 (×7): 237 mL via ORAL

## 2021-05-23 MED ORDER — ADULT MULTIVITAMIN W/MINERALS CH
1.0000 | ORAL_TABLET | Freq: Every day | ORAL | Status: DC
  Administered 2021-05-23 – 2021-05-26 (×4): 1 via ORAL
  Filled 2021-05-23 (×4): qty 1

## 2021-05-23 MED ORDER — ENOXAPARIN SODIUM 40 MG/0.4ML IJ SOSY
40.0000 mg | PREFILLED_SYRINGE | INTRAMUSCULAR | Status: DC
  Administered 2021-05-23 – 2021-05-25 (×3): 40 mg via SUBCUTANEOUS
  Filled 2021-05-23 (×3): qty 0.4

## 2021-05-23 MED ORDER — LEVOFLOXACIN IN D5W 750 MG/150ML IV SOLN: 750.0000 mg | INTRAVENOUS | Status: DC

## 2021-05-23 MED ORDER — FUROSEMIDE 10 MG/ML IJ SOLN
40.0000 mg | Freq: Once | INTRAMUSCULAR | Status: AC
  Administered 2021-05-23: 40 mg via INTRAVENOUS
  Filled 2021-05-23: qty 4

## 2021-05-23 MED ORDER — SODIUM CHLORIDE 0.9 % IV SOLN
2.0000 g | INTRAVENOUS | Status: DC
  Administered 2021-05-23 – 2021-05-25 (×3): 2 g via INTRAVENOUS
  Filled 2021-05-23 (×2): qty 20
  Filled 2021-05-23 (×2): qty 2

## 2021-05-23 MED ORDER — SODIUM CHLORIDE 0.9 % IV SOLN
500.0000 mg | INTRAVENOUS | Status: DC
  Administered 2021-05-23 – 2021-05-25 (×3): 500 mg via INTRAVENOUS
  Filled 2021-05-23 (×3): qty 500
  Filled 2021-05-23: qty 5

## 2021-05-23 NOTE — TOC Progression Note (Addendum)
Transition of Care [facility]) - Progression Note    Patient Details  Name: Frank Khan MRN: [id number] Date of Birth: June 25, 1934  Transition of Care [facility]) CM/SW Contact  Marlowe Sax, RN Phone Number: 05/23/2021, 10:49 AM  Clinical Narrative:   Spoke with Sundra Aland, Requested that [facility] review for a bed     Expected Discharge Plan: Skilled Nursing Facility Barriers to Discharge: SNF Pending bed offer  Expected Discharge Plan and Services Expected Discharge Plan: Skilled Nursing Facility       Living arrangements for the past 2 months: Single Family Home                                       Social Determinants of Health (SDOH) Interventions    Readmission Risk Interventions No flowsheet data found.

## 2021-05-23 NOTE — Progress Notes (Signed)
Initial Nutrition Assessment  DOCUMENTATION CODES:   Underweight, Severe malnutrition in context of chronic illness  INTERVENTION:   -Ensure Enlive po TID, each supplement provides 350 kcal and 20 grams of protein  -MVI with minerals daily -Magic cup TID with meals, each supplement provides 290 kcal and 9 grams of protein  -Feeding assistance with meals -Downgrade diet to dysphagia 3 diet for ease of intake  NUTRITION DIAGNOSIS:   Severe Malnutrition related to chronic illness (dementia) as evidenced by moderate fat depletion, severe fat depletion, moderate muscle depletion, severe muscle depletion.  GOAL:   Patient will meet greater than or equal to 90% of their needs  MONITOR:   PO intake, Supplement acceptance, Labs, Weight trends, Skin, I & O's  REASON FOR ASSESSMENT:   Consult Assessment of nutrition requirement/status  ASSESSMENT:   Frank Khan is a 87 y.o. male with medical history significant of dementia, pulmonary fibrosis on 2 L oxygen, CAD, stent placement, CKD-3A, hypertension, hyperlipidemia, diet-controlled diabetes, TIA, GERD, depression, anxiety, who presents with weakness, altered mental status and fall.  Pt admitted with SIRS.   Reviewed I/O's: +2.5 L x 24 hours and +2.7 L since admission  UOP: 450 ml x 24 hours  Pt sitting up in bed at time of visit. Pt complains that he is cold; RD adjusted blankets for pt comfort.   Pt not very talkative, reports he ate breakfast this AM. No family present to provide further history. Noted meal completions 0-50%.   Per [facility] notes, pt with partial thickness skin loss on sacrum consistent with MASD and venous stasis changes on bilateral legs.   Reviewed wt hx; wt has been stable over the past 3 months.   Medications reviewed and include lasix and remeron.   Labs reviewed: CBGS: 56-220 (inpatient orders for glycemic control are 0-5 units inuslin aspart daily at bedtime and 0-9 units inuslin aspart TID with  meals).    NUTRITION - FOCUSED PHYSICAL EXAM:  Flowsheet Row Most Recent Value  Orbital Region Moderate depletion  Upper Arm Region Severe depletion  Thoracic and Lumbar Region Moderate depletion  Buccal Region Severe depletion  Temple Region Severe depletion  Clavicle Bone Region Severe depletion  Clavicle and Acromion Bone Region Severe depletion  Scapular Bone Region Severe depletion  Dorsal Hand Moderate depletion  Patellar Region Moderate depletion  Anterior Thigh Region Moderate depletion  Posterior Calf Region Moderate depletion  Edema (RD Assessment) Mild  Hair Reviewed  Eyes Reviewed  Mouth Reviewed  Skin Reviewed  Nails Reviewed       Diet Order:   Diet Order             Diet Heart Room service appropriate? Yes; Fluid consistency: Thin  Diet effective now                   EDUCATION NEEDS:   Not appropriate for education at this time  Skin:  Skin Assessment: Skin Integrity Issues: Skin Integrity Issues:: Other (Comment) Other: partial thickness skin loss/ MASD to sacrum and venosu stasis changes to bilateral legs  Last BM:  Unknown  Height:   Ht Readings from Last 1 Encounters:  05/18/21 6' (1.829 m)    Weight:   Wt Readings from Last 1 Encounters:  05/18/21 54.4 kg    Ideal Body Weight:  80.9 kg  BMI:  Body mass index is 16.27 kg/m.  Estimated Nutritional Needs:   Kcal:  1900-2100  Protein:  105-120 grams  Fluid:  > 1.9 L  Loistine Chance, RD, LDN, [place] Registered Dietitian II Certified Diabetes Care and Education Specialist Please refer to [facility] for RD and/or RD on-call/weekend/after hours pager

## 2021-05-23 NOTE — Plan of Care (Signed)
Assumed care of patient at 2300. NO acute distress noted. Pt oriented to self and place. Denies pain. Oxygen on at 2L . Bed in lowest position. Bed alarm on.  PLAN OF CARE ONGOING Problem: Education: Goal: Knowledge of General Education information will improve Description: Including pain rating scale, medication(s)/side effects and non-pharmacologic comfort measures Outcome: Progressing   Problem: Health Behavior/Discharge Planning: Goal: Ability to manage health-related needs will improve Outcome: Progressing   Problem: Clinical Measurements: Goal: Ability to maintain clinical measurements within normal limits will improve Outcome: Progressing Goal: Will remain free from infection Outcome: Progressing Goal: Diagnostic test results will improve Outcome: Progressing Goal: Respiratory complications will improve Outcome: Progressing Goal: Cardiovascular complication will be avoided Outcome: Progressing   Problem: Activity: Goal: Risk for activity intolerance will decrease Outcome: Progressing   Problem: Nutrition: Goal: Adequate nutrition will be maintained Outcome: Progressing   Problem: Coping: Goal: Level of anxiety will decrease Outcome: Progressing   Problem: Elimination: Goal: Will not experience complications related to bowel motility Outcome: Progressing Goal: Will not experience complications related to urinary retention Outcome: Progressing   Problem: Pain Managment: Goal: General experience of comfort will improve Outcome: Progressing   Problem: Safety: Goal: Ability to remain free from injury will improve Outcome: Progressing   Problem: Skin Integrity: Goal: Risk for impaired skin integrity will decrease Outcome: Progressing

## 2021-05-23 NOTE — Progress Notes (Signed)
Pending chest xray r/o PNA

## 2021-05-23 NOTE — Progress Notes (Signed)
Physical Therapy Treatment Patient Details Name: Frank Khan MRN: [id number] DOB: [birth year] Today's Date: 05/23/2021   History of Present Illness Frank Khan is an 86yoM who comes to [facility] on 12/29 after weakness, altered mental status, fall at home. PMH: pulmonary fibrosis on 2 L oxygen, CAD, stent placement, CKD-3A, hypertension, hyperlipidemia, diet-controlled diabetes, TIA, GERD, depression, anxiety. Head CT shows chronic right occipital lobe infarct. Pt has Stg 2 sacral ulcer wound care attributing to moisture/friction etiology mor elikely than pressure injury.    PT Comments    Pt required min to mod encouragement to participate during the session with education provided on physiological benefits of activity.  Pt found on 3.5LO2/min with SpO2 91%.  After therex and sup to sit SpO2 remained in the low 90s.  Pt was able to amb several steps at the EOB this session with min A for stability with SpO2 dropping to 88%.  Upon returning to supine with pt exerting significant effort to bring his BLE's into bed his SpO2 dropped to the mid 70's and took 1.5 to 2 minutes to return to 89% with cues for PLB. Nursing contacted and requested O2 be brought up to 5LO2/min.  Pt will benefit from PT services in a SNF setting upon discharge to safely address deficits listed in patient problem list for decreased caregiver assistance and eventual return to PLOF.    Recommendations for follow up therapy are one component of a multi-disciplinary discharge planning process, led by the attending physician.  Recommendations may be updated based on patient status, additional functional criteria and insurance authorization.  Follow Up Recommendations  Skilled nursing-short term rehab (<3 hours/day)     Assistance Recommended at Discharge Frequent or constant Supervision/Assistance  Patient can return home with the following Two people to help with walking and/or transfers;Direct supervision/assist for medications  management;Help with stairs or ramp for entrance;A lot of help with bathing/dressing/bathroom;Assistance with cooking/housework;Assist for transportation   Equipment Recommendations  Other (comment) (TBD at next venue of care)    Recommendations for Other Services       Precautions / Restrictions Precautions Precautions: Fall Restrictions Weight Bearing Restrictions: No Other Position/Activity Restrictions: Watch SpO2 closely     Mobility  Bed Mobility Overal bed mobility: Needs Assistance Bed Mobility: Supine to Sit;Sit to Supine;Rolling Rolling: Mod assist   Supine to sit: Mod assist Sit to supine: Mod assist   General bed mobility comments: cues for sequencing and rail use with Mod A for BLE and trunk control    Transfers Overall transfer level: Needs assistance Equipment used: Rolling walker (2 wheels) Transfers: Sit to/from Stand Sit to Stand: Mod assist;From elevated surface           General transfer comment: Verbal cues for increased trunk flexion and hand placement    Ambulation/Gait Ambulation/Gait assistance: Min assist Gait Distance (Feet): 3 Feet Assistive device: Rolling walker (2 wheels) Gait Pattern/deviations: Step-to pattern;Decreased step length - right;Decreased step length - left;Trunk flexed Gait velocity: decreased     General Gait Details: Pt able to take several side-steps at the EOB and to amb forward/backward at EOB for a total of grossly 3-4 feet before becoming fatigued and needing to sit; see above for SpO2 results   Stairs             Wheelchair Mobility    Modified Rankin (Stroke Patients Only)       Balance Overall balance assessment: Needs assistance Sitting-balance support: No upper extremity supported;Feet unsupported Sitting balance-Leahy Scale: Fair  Standing balance support: Bilateral upper extremity supported;During functional activity Standing balance-Leahy Scale: Poor                               Cognition Arousal/Alertness: Awake/alert Behavior During Therapy: WFL for tasks assessed/performed Overall Cognitive Status: History of cognitive impairments - at baseline                                          Exercises Total Joint Exercises Ankle Circles/Pumps: AROM;Strengthening;Both;10 reps;15 reps Quad Sets: 10 reps;15 reps;Both;Strengthening;AROM Gluteal Sets: Strengthening;Both;10 reps Heel Slides: AAROM;Strengthening;Both;5 reps Hip ABduction/ADduction: AAROM;Strengthening;Both;5 reps Straight Leg Raises: 5 reps;Both;Strengthening;AAROM Long Arc Quad: AROM;Strengthening;Both;10 reps;15 reps Bridges: Strengthening;Both;5 reps (unable to fully clear bed surface) Other Exercises Other Exercises: HEP education for BLE APs, QS, and GS x 10 each every 1-2 hours daily    General Comments        Pertinent Vitals/Pain Pain Assessment: 0-10 Pain Score: 5  Pain Location: Low back Pain Descriptors / Indicators: Aching;Sore Pain Intervention(s): Repositioned;Premedicated before session;Monitored during session    Home Living                          Prior Function            PT Goals (current goals can now be found in the care plan section) Progress towards PT goals: Progressing toward goals    Frequency    Min 2X/week      PT Plan Current plan remains appropriate    Co-evaluation              AM-PAC PT "6 Clicks" Mobility   Outcome Measure  Help needed turning from your back to your side while in a flat bed without using bedrails?: A Lot Help needed moving from lying on your back to sitting on the side of a flat bed without using bedrails?: A Lot Help needed moving to and from a bed to a chair (including a wheelchair)?: A Lot Help needed standing up from a chair using your arms (e.g., wheelchair or bedside chair)?: A Lot Help needed to walk in hospital room?: Total Help needed climbing 3-5 steps with a railing? :  Total 6 Click Score: 10    End of Session Equipment Utilized During Treatment: Oxygen Activity Tolerance: Patient limited by fatigue Patient left: in bed;with call bell/phone within reach;with bed alarm set Nurse Communication: Mobility status;Other (comment) (SpO2 desaturation) PT Visit Diagnosis: Difficulty in walking, not elsewhere classified (R26.2);Other abnormalities of gait and mobility (R26.89);Muscle weakness (generalized) (M62.81)     Time: 3532-9924 PT Time Calculation (min) (ACUTE ONLY): 31 min  Charges:  $Therapeutic Exercise: 8-22 mins $Therapeutic Activity: 8-22 mins                     D. Scott Kariana Wiles PT, DPT 05/23/21, 2:21 PM

## 2021-05-23 NOTE — Progress Notes (Signed)
PHARMACIST - PHYSICIAN COMMUNICATION  CONCERNING:  Enoxaparin (Lovenox) for DVT Prophylaxis    RECOMMENDATION: Patient was prescribed enoxaprin 30mg  q24 hours for VTE prophylaxis.   Filed Weights   05/18/21 2354  Weight: 54.4 kg (120 lb)    Body mass index is 16.27 kg/m.  Estimated Creatinine Clearance: 35.3 mL/min (by C-G formula based on SCr of 1.2 mg/dL).  Patient is candidate for enoxaparin 40mg  every 24 hours based on now CrCl >93ml/min   DESCRIPTION: Pharmacy has adjusted enoxaparin dose per [facility] policy.  Patient is now receiving enoxaparin 40 mg every 24 hours    Noralee Space, PharmD Clinical Pharmacist  05/23/2021 12:35 PM

## 2021-05-23 NOTE — TOC Progression Note (Addendum)
Transition of Care [facility]) - Progression Note    Patient Details  Name: Frank Khan MRN: [id number] Date of Birth: Feb 11, 1935  Transition of Care [facility]) CM/SW Contact  Marlowe Sax, RN Phone Number: 05/23/2021, 4:03 PM  Clinical Narrative:   Sherron Monday with Kathlene November the patient's son, reviewed the bed offers, He stated that he is trying to get the patient signed up for Medicare B, I explained that Medicare does not cover Long term care, He accepted the bed offer at [facility] for STR, He is going to start to look for long term beds as well for in the even the patent will not be able to go home after rehab, They prefer that he go home and be more independent if possible, called [facility]  and left a secure VM accepted the bed offer, requested ins auth    Expected Discharge Plan: Skilled Nursing Facility Barriers to Discharge: SNF Pending bed offer  Expected Discharge Plan and Services Expected Discharge Plan: Skilled Nursing Facility       Living arrangements for the past 2 months: Single Family Home                                       Social Determinants of Health (SDOH) Interventions    Readmission Risk Interventions No flowsheet data found.

## 2021-05-23 NOTE — TOC Progression Note (Signed)
Transition of Care [facility]) - Progression Note    Patient Details  Name: Frank Khan MRN: [id number] Date of Birth: [birth year]  Transition of Care [facility]) CM/SW Contact  Marlowe Sax, RN Phone Number: 05/23/2021, 9:11 AM  Clinical Narrative:    No bed offers yet, reached out to local facilities requesting to review for a bed offer, will review offers once obtained   Expected Discharge Plan: Skilled Nursing Facility Barriers to Discharge: SNF Pending bed offer  Expected Discharge Plan and Services Expected Discharge Plan: Skilled Nursing Facility       Living arrangements for the past 2 months: Single Family Home                                       Social Determinants of Health (SDOH) Interventions    Readmission Risk Interventions No flowsheet data found.

## 2021-05-23 NOTE — Progress Notes (Signed)
PROGRESS NOTE    Frank Khan  [id number] DOB: 08/09/1934 DOA: 05/19/2021 PCP: Rusty Aus, MD    Brief Narrative:  87 y.o. male with medical history significant of dementia, pulmonary fibrosis on 2 L oxygen, CAD, stent placement, CKD-3A, hypertension, hyperlipidemia, diet-controlled diabetes, TIA, GERD, depression, anxiety, who presents with weakness, altered mental status and fall. Pt was admitted for w/u of SIRS criteria with lactate  Assessment & Plan:   Principal Problem:   SIRS (systemic inflammatory response syndrome) (HCC) Active Problems:   TIA (transient ischemic attack)   Pulmonary fibrosis (HCC)   Hypertension   Type II diabetes mellitus with renal manifestations (HCC)   CKD (chronic kidney disease), stage IIIa   HLD (hyperlipidemia)   Depression with anxiety   CAD (coronary artery disease)   Sacral ulcer (HCC)   Acute metabolic encephalopathy   Fall at home, initial encounter  SIRS (systemic inflammatory response syndrome) ([place]):  -Patient meets criteria for SIRS with hypothermia with temperature 96.3, tachycardia with heart rate 105, tachypnea with RR 23.  Lactic acid is elevated 3.1 which is normalized with IV fluid treatment.   -Initially no source of infection identified.  Urinalysis negative.  Presenting chest x-ray did not show infiltration.  COVID PCR negative.   -presenting procalcitonin was < 0.10.  -Urine and blood cx neg -Recently appeared dehydrated, given course of IVF -Acutely hypoxemic today, prompting repeat CXR. Reviewed with findings worrisome for new R lung base opacity worrisome for PNA. Have started empiric azithro and rocephin   Fall at home, initial encounter:  -Imaging negative for acute injury.  CT head negative. -Fall precaution -PT/OT consulted with recs for SNF -TOC cont to follow. Plan d/c once pt is more medically stable   TIA (transient ischemic attack): -Cont Aspirin, Zetia   HLD: -Zetia   Pulmonary fibrosis  ([place]) -Baseline 2LNC -on 05/23/21, required increased O2 requirements up to [facility] -CXR per above -On bronchodilators as needed   Hypertension:  -Cont IV hydralazine as needed -BP soft   Type II diabetes mellitus with renal manifestations ([place]):  -Recent A1c 5.8.  Well-controlled.  Patient not taking medications currently. -continue on SSI coverage as needed   CKD (chronic kidney disease), stage IIIa: Slightly worsening.  Recent baseline creatinine 1.1 on 12/30/2020.   -Cr improved to 1.2 with IVF -Given acute hypoxemia on 1/3, IVF had been held   Depression with anxiety -Continue home medications   CAD (coronary artery disease) -Aspirin, Zetia   Acute metabolic encephalopathy:  -Likely multifactorial etiology including SIRS, worsening renal function.  No focal neuro deficit on physical examination.   -CT head with generalized cerebral atrophy, chronic R occipital lobe infarct  Moisture associated skin injury ruled in, Sacral ulcer ruled out [facility]):  -appreciate input by WOC  Dehydration -Pt with dry mucus membranes, appears clinically dehydrated -Observed with meal tray in front of pt however pt not eating -Encourage PO as tolerated -dietitan was consulted -Improved with IVF hydration  Acute on chronic hypoxemic respiratory failure -Baseline 2LNC secondary to pulm fibrosis -Acutely needing 4LNC while working with PT on 1/3 -Repeat CXR with findings worrisome for R sided PNA. Now on abx per above    DVT prophylaxis: Lovenox subq Code Status: DNR Family Communication: Pt in room, family not at bedside  Status is: Inpatient  Remains inpatient appropriate because: Severity of illness and disposition issues   Consultants:    Procedures:    Antimicrobials: Anti-infectives (From admission, onward)    Start  Dose/Rate Route Frequency Ordered Stop   05/23/21 1315  cefTRIAXone (ROCEPHIN) 2 g in sodium chloride 0.9 % 100 mL IVPB        2 g 200 mL/hr over 30 Minutes  Intravenous Every 24 hours 05/23/21 1227 05/28/21 1314   05/23/21 1315  azithromycin (ZITHROMAX) 500 mg in sodium chloride 0.9 % 250 mL IVPB        500 mg 250 mL/hr over 60 Minutes Intravenous Every 24 hours 05/23/21 1227 05/28/21 1314   05/23/21 1300  levofloxacin (LEVAQUIN) IVPB 750 mg  Status:  Discontinued        750 mg 100 mL/hr over 90 Minutes Intravenous Every 24 hours 05/23/21 1206 05/23/21 1220   05/23/21 1300  levofloxacin (LEVAQUIN) IVPB 750 mg  Status:  Discontinued        750 mg 100 mL/hr over 90 Minutes Intravenous Every 48 hours 05/23/21 1221 05/23/21 1233   05/19/21 0230  ceFEPIme (MAXIPIME) 2 g in sodium chloride 0.9 % 100 mL IVPB        2 g 200 mL/hr over 30 Minutes Intravenous  Once 05/19/21 0226 05/19/21 0329   05/19/21 0230  metroNIDAZOLE (FLAGYL) IVPB 500 mg        500 mg 100 mL/hr over 60 Minutes Intravenous  Once 05/19/21 0226 05/19/21 0501   05/19/21 0230  vancomycin (VANCOCIN) IVPB 1000 mg/200 mL premix        1,000 mg 200 mL/hr over 60 Minutes Intravenous  Once 05/19/21 0226 05/19/21 0658       Subjective: Awake, confused  Objective: Vitals:   05/23/21 0513 05/23/21 0750 05/23/21 1122 05/23/21 1500  BP: 131/78 131/78 124/73 96/63  Pulse: 89 89 89 99  Resp: 19 16 18 18   Temp: 97.7 F (36.5 C) 98.2 F (36.8 C) 98.4 F (36.9 C) 98.2 F (36.8 C)  TempSrc:      SpO2: 98% 100% 99% 98%  Weight:      Height:        Intake/Output Summary (Last 24 hours) at 05/23/2021 1713 Last data filed at 05/23/2021 1500 Gross per 24 hour  Intake 2672.72 ml  Output 1650 ml  Net 1022.72 ml    Filed Weights   05/18/21 2354  Weight: 54.4 kg    Examination: General exam: Conversant, in no acute distress Respiratory system: normal chest rise, clear, no audible wheezing Cardiovascular system: regular rhythm, s1-s2 Gastrointestinal system: Nondistended, nontender, pos BS Central nervous system: No seizures, no tremors Extremities: No cyanosis, no joint  deformities Skin: No rashes, no pallor Psychiatry: Affect normal // no auditory hallucinations   Data Reviewed: I have personally reviewed following labs and imaging studies  CBC: Recent Labs  Lab 05/19/21 0025 05/20/21 0429  WBC 9.4 7.3  NEUTROABS 8.3*  --   HGB 14.4 13.5  HCT 44.0 41.5  MCV 95.4 99.0  PLT 158 146*    Basic Metabolic Panel: Recent Labs  Lab 05/19/21 0025 05/20/21 0429 05/21/21 0501 05/23/21 0452  NA 137 137 135 136  K 4.1 4.7 4.9 4.2  CL 105 105 104 110  CO2 24 27 23 23   GLUCOSE 198* 70 66* 137*  BUN 23 21 31* 21  CREATININE 1.29* 1.32* 1.65* 1.20  CALCIUM 8.7* 8.7* 8.5* 7.9*    GFR: Estimated Creatinine Clearance: 35.3 mL/min (by C-G formula based on SCr of 1.2 mg/dL). Liver Function Tests: Recent Labs  Lab 05/19/21 0025 05/21/21 0501 05/23/21 0452  AST 35 175* 69*  ALT 18 130* 75*  ALKPHOS 94 92 87  BILITOT 0.9 0.9 0.9  PROT 6.7 6.0* 5.3*  ALBUMIN 3.5 3.0* 2.7*    Recent Labs  Lab 05/19/21 0025  LIPASE 52*    No results for input(s): AMMONIA in the last 168 hours. Coagulation Profile: Recent Labs  Lab 05/19/21 0025  INR 1.0    Cardiac Enzymes: No results for input(s): CKTOTAL, CKMB, CKMBINDEX, TROPONINI in the last 168 hours. BNP (last 3 results) No results for input(s): PROBNP in the last 8760 hours. HbA1C: No results for input(s): HGBA1C in the last 72 hours. CBG: Recent Labs  Lab 05/22/21 1641 05/22/21 2048 05/23/21 0724 05/23/21 1118 05/23/21 1507  GLUCAP 95 106* 192* 188* 131*    Lipid Profile: No results for input(s): CHOL, HDL, LDLCALC, TRIG, CHOLHDL, LDLDIRECT in the last 72 hours. Thyroid Function Tests: No results for input(s): TSH, T4TOTAL, FREET4, T3FREE, THYROIDAB in the last 72 hours. Anemia Panel: No results for input(s): VITAMINB12, FOLATE, FERRITIN, TIBC, IRON, RETICCTPCT in the last 72 hours. Sepsis Labs: Recent Labs  Lab 05/19/21 0025 05/19/21 0610  PROCALCITON <0.10  --    LATICACIDVEN 3.1* 1.0     Recent Results (from the past 240 hour(s))  Culture, blood (Routine x 2)     Status: None (Preliminary result)   Collection Time: 05/19/21 12:25 AM   Specimen: BLOOD  Result Value Ref Range Status   Specimen Description BLOOD RIGHT FOREARM  Final   Special Requests   Final    BOTTLES DRAWN AEROBIC AND ANAEROBIC Blood Culture results may not be optimal due to an inadequate volume of blood received in culture bottles   Culture   Final    NO GROWTH 4 DAYS Performed at [facility], [address], [place], [place] [address]    Report Status PENDING  Incomplete  Culture, blood (Routine x 2)     Status: None (Preliminary result)   Collection Time: 05/19/21 12:25 AM   Specimen: BLOOD  Result Value Ref Range Status   Specimen Description BLOOD RIGHT FOREARM  Final   Special Requests   Final    BOTTLES DRAWN AEROBIC AND ANAEROBIC Blood Culture results may not be optimal due to an inadequate volume of blood received in culture bottles   Culture   Final    NO GROWTH 4 DAYS Performed at [facility], [address] [place], [place] [address]    Report Status PENDING  Incomplete  Resp Panel by RT-PCR (Flu A&B, Covid) Nasopharyngeal Swab     Status: None   Collection Time: 05/19/21 12:25 AM   Specimen: Nasopharyngeal Swab; Nasopharyngeal(NP) swabs in vial transport medium  Result Value Ref Range Status   SARS Coronavirus 2 by RT PCR NEGATIVE NEGATIVE Final    Comment: (NOTE) SARS-CoV-2 target nucleic acids are NOT DETECTED.  The SARS-CoV-2 RNA is generally detectable in upper respiratory specimens during the acute phase of infection. The lowest concentration of SARS-CoV-2 viral copies this assay can detect is 138 copies/mL. A negative result does not preclude SARS-Cov-2 infection and should not be used as the sole basis for treatment or other patient management decisions. A negative result may occur with  improper specimen  collection/handling, submission of specimen other than nasopharyngeal swab, presence of viral mutation(s) within the areas targeted by this assay, and inadequate number of viral copies(<138 copies/mL). A negative result must be combined with clinical observations, patient history, and epidemiological information. The expected result is Negative.  Fact Sheet for Patients:  EntrepreneurPulse.com.au  Fact  Sheet for Healthcare Providers:  SeriousBroker.it  This test is no t yet approved or cleared by the [place] FDA and  has been authorized for detection and/or diagnosis of SARS-CoV-2 by FDA under an Emergency Use Authorization (EUA). This EUA will remain  in effect (meaning this test can be used) for the duration of the COVID-19 declaration under Section 564(b)(1) of the Act, 21 U.S.C.section 360bbb-3(b)(1), unless the authorization is terminated  or revoked sooner.       Influenza A by PCR NEGATIVE NEGATIVE Final   Influenza B by PCR NEGATIVE NEGATIVE Final    Comment: (NOTE) The Xpert Xpress SARS-CoV-2/FLU/RSV plus assay is intended as an aid in the diagnosis of influenza from Nasopharyngeal swab specimens and should not be used as a sole basis for treatment. Nasal washings and aspirates are unacceptable for Xpert Xpress SARS-CoV-2/FLU/RSV testing.  Fact Sheet for Patients: BloggerCourse.com  Fact Sheet for Healthcare Providers: SeriousBroker.it  This test is not yet approved or cleared by the [place] FDA and has been authorized for detection and/or diagnosis of SARS-CoV-2 by FDA under an Emergency Use Authorization (EUA). This EUA will remain in effect (meaning this test can be used) for the duration of the COVID-19 declaration under Section 564(b)(1) of the Act, 21 U.S.C. section 360bbb-3(b)(1), unless the authorization is terminated or revoked.  Performed at [facility], [address] [place], [place] [address]   Urine Culture     Status: None   Collection Time: 05/19/21  3:46 AM   Specimen: Urine, Clean Catch  Result Value Ref Range Status   Specimen Description   Final    URINE, CLEAN CATCH Performed at [facility], [address] [place], [place] [address]    Special Requests   Final    NONE Performed at [facility], [address], [place], [place] [address]    Culture   Final    NO GROWTH Performed at [facility], [address], [place], [place] [address]    Report Status 05/20/2021 FINAL  Final      Radiology Studies: DG Chest Port 1 View  Result Date: 05/23/2021 CLINICAL DATA:  Hypoxemia R09.02 (ICD-10-CM) EXAM: PORTABLE CHEST 1 VIEW COMPARISON:  05/19/2021. FINDINGS: Low lung volumes. Extensive bilateral parenchymal lung scarring/fibrosis. New airspace opacity at the right lung base. No visible pleural effusions or pneumothorax. Cardiomediastinal silhouette is within normal limits. Calcific atherosclerosis of the aorta. IMPRESSION: Extensive bilateral lung scarring fibrosis. New airspace opacity at the right lung base, suspicious for pneumonia. Followup PA and lateral chest X-ray is recommended in 3-4 weeks following trial of antibiotic therapy to ensure resolution and exclude underlying malignancy. These results will be called to the ordering clinician or representative by the Radiologist Assistant, and communication documented in the PACS or [employer]. Electronically Signed   By: Feliberto Harts M.D.   On: 05/23/2021 11:05    Scheduled Meds:  aspirin  81 mg Oral Daily   enoxaparin (LOVENOX) injection  40 mg Subcutaneous Q24H   ezetimibe  10 mg Oral Daily   feeding supplement  237 mL Oral TID BM   hydrocerin   Topical Daily   insulin aspart  0-5 Units Subcutaneous QHS   insulin aspart  0-9 Units Subcutaneous TID WC   mirtazapine  15 mg Oral QHS   multivitamin with minerals  1 tablet  Oral Daily   pantoprazole  40 mg Oral Daily   sertraline  50 mg Oral Daily  Continuous Infusions:  azithromycin 500 mg (05/23/21 1404)   cefTRIAXone (ROCEPHIN)  IV 2 g (05/23/21 1413)     LOS: 4 days   Marylu Lund, MD [facility] Pager On Amion  If 7PM-7AM, please contact night-coverage 05/23/2021, 5:13 PM

## 2021-05-23 NOTE — Progress Notes (Signed)
OT Cancellation Note  Patient Details Name: Frank Khan MRN: [id number] DOB: Jul 16, 1934   Cancelled Treatment:    Reason Eval/Treat Not Completed: Patient declined, no reason specified.  Upon arrival pt in bed, refuses OOB or EOB activity citing fatigue (rates pain 5/10). Pt requesting assistance for drinking, requires MAX encouragement to complete Independently. Will continue to follow POC as able.   Kathie Dike, M.S. OTR/L  05/23/21, 11:59 AM  ascom [phone]

## 2021-05-23 NOTE — Progress Notes (Signed)
PHARMACY NOTE:  ANTIMICROBIAL RENAL DOSAGE ADJUSTMENT  Current antimicrobial regimen includes a mismatch between antimicrobial dosage and estimated renal function.  As per policy approved by the Pharmacy & Therapeutics and Medical Executive Committees, the antimicrobial dosage will be adjusted accordingly.  Current antimicrobial dosage:  Levaquin 750mg  IV q24hrs for 5 days  Indication: CAP  Renal Function:  Estimated Creatinine Clearance: 35.3 mL/min (by C-G formula based on SCr of 1.2 mg/dL). []      On intermittent HD, scheduled: []      On CRRT    Antimicrobial dosage has been changed to:  Levaquin 750mg  IV q48hrs for 3 doses  Additional comments:   Thank you for allowing pharmacy to be a part of this patient's care.  , [facility] 05/23/2021 12:34 PM

## 2021-05-23 NOTE — TOC Progression Note (Signed)
Transition of Care [facility]) - Progression Note    Patient Details  Name: Frank Khan MRN: [id number] Date of Birth: 12-12-1934  Transition of Care [facility]) CM/SW Contact  Marlowe Sax, RN Phone Number: 05/23/2021, 1:32 PM  Clinical Narrative:   Spoke to the patient's son Frank Khan, Reviewed the bed offers, I reached out to [facility] to see if they could make an offer, her does prefer [facility], awaiting to hear back from her.     Expected Discharge Plan: Skilled Nursing Facility Barriers to Discharge: SNF Pending bed offer  Expected Discharge Plan and Services Expected Discharge Plan: Skilled Nursing Facility       Living arrangements for the past 2 months: Single Family Home                                       Social Determinants of Health (SDOH) Interventions    Readmission Risk Interventions No flowsheet data found.

## 2021-05-24 DIAGNOSIS — E43 Unspecified severe protein-calorie malnutrition: Secondary | ICD-10-CM | POA: Insufficient documentation

## 2021-05-24 LAB — COMPREHENSIVE METABOLIC PANEL
ALT: 63 U/L — ABNORMAL HIGH (ref 0–44)
AST: 54 U/L — ABNORMAL HIGH (ref 15–41)
Albumin: 2.7 g/dL — ABNORMAL LOW (ref 3.5–5.0)
Alkaline Phosphatase: 90 U/L (ref 38–126)
Anion gap: 7 (ref 5–15)
BUN: 21 mg/dL (ref 8–23)
CO2: 27 mmol/L (ref 22–32)
Calcium: 8.2 mg/dL — ABNORMAL LOW (ref 8.9–10.3)
Chloride: 105 mmol/L (ref 98–111)
Creatinine, Ser: 1.23 mg/dL (ref 0.61–1.24)
GFR, Estimated: 58 mL/min — ABNORMAL LOW (ref >60)
Glucose, Bld: 68 mg/dL — ABNORMAL LOW (ref 70–99)
Potassium: 4.3 mmol/L (ref 3.5–5.1)
Sodium: 139 mmol/L (ref 135–145)
Total Bilirubin: 0.9 mg/dL (ref 0.3–1.2)
Total Protein: 5.5 g/dL — ABNORMAL LOW (ref 6.5–8.1)

## 2021-05-24 LAB — CBC
HCT: 40.3 % (ref 39.0–52.0)
Hemoglobin: 13.1 g/dL (ref 13.0–17.0)
MCH: 31.3 pg (ref 26.0–34.0)
MCHC: 32.5 g/dL (ref 30.0–36.0)
MCV: 96.4 fL (ref 80.0–100.0)
Platelets: 150 10*3/uL (ref 150–400)
RBC: 4.18 MIL/uL — ABNORMAL LOW (ref 4.22–5.81)
RDW: 13.8 % (ref 11.5–15.5)
WBC: 6.2 10*3/uL (ref 4.0–10.5)
nRBC: 0 % (ref 0.0–0.2)

## 2021-05-24 LAB — CULTURE, BLOOD (ROUTINE X 2)
Culture: NO GROWTH
Culture: NO GROWTH

## 2021-05-24 LAB — ECHOCARDIOGRAM COMPLETE
AR max vel: 0.85 cm2
AV Area VTI: 0.82 cm2
AV Area mean vel: 0.81 cm2
AV Mean grad: 5.7 mmHg
AV Peak grad: 9 mmHg
Ao pk vel: 1.5 m/s
Area-P 1/2: 3.12 cm2
S' Lateral: 2.7 cm

## 2021-05-24 LAB — GLUCOSE, CAPILLARY
Glucose-Capillary: 88 mg/dL (ref 70–99)
Glucose-Capillary: 219 mg/dL — ABNORMAL HIGH (ref 70–99)
Glucose-Capillary: 170 mg/dL — ABNORMAL HIGH (ref 70–99)
Glucose-Capillary: 151 mg/dL — ABNORMAL HIGH (ref 70–99)

## 2021-05-24 MED ORDER — BISACODYL 10 MG RE SUPP
10.0000 mg | Freq: Once | RECTAL | Status: AC
  Administered 2021-05-24: 10 mg via RECTAL
  Filled 2021-05-24: qty 1

## 2021-05-24 MED ORDER — DEXTROSE-NACL 5-0.9 % IV SOLN: INTRAVENOUS | Status: DC

## 2021-05-24 MED ORDER — DOCUSATE SODIUM 100 MG PO CAPS: 100.0000 mg | ORAL_CAPSULE | Freq: Two times a day (BID) | ORAL | Status: DC | PRN

## 2021-05-24 NOTE — TOC Progression Note (Signed)
Transition of Care [facility]) - Progression Note    Patient Details  Name: Frank Khan MRN: [id number] Date of Birth: May 26, 1934  Transition of [facility]) CM/SW [place], LCSW Phone Number: 05/24/2021, 10:45 AM  Clinical Narrative:  Would only be able to seek SNF placement using VA insurance if patient did not have any other insurance available. Patient also has Medicare Part A. [name] is checking patient's Medicare benefits.   Expected Discharge Plan: Skilled Nursing Facility Barriers to Discharge: SNF Pending bed offer  Expected Discharge Plan and Services Expected Discharge Plan: [place] arrangements for the past 2 months: Single Family Home                                       Social Determinants of Health (SDOH) Interventions    Readmission Risk Interventions No flowsheet data found.

## 2021-05-24 NOTE — Progress Notes (Signed)
PROGRESS NOTE    VILLA HOLLRAH  [id number] DOB: Mar 24, 1935 DOA: 05/19/2021 PCP: Rusty Aus, MD   Chief Complaint  Patient presents with   Weakness    Brief Narrative/Hospital Course: Frank Khan, 87 y.o. male with PMH significant for dementia, pulmonary fibrosis/chronic hypoxic respiratory failure on 2 L oxygen, CAD- stent placement, CKD-3a, hypertension, hyperlipidemia, diet-controlled diabetes, TIA, GERD, depression, anxiety, presented with weakness altered mental status and fall and admitted for SIRS criteria , lactic acidosis Chest x-ray 12/30 bilateral parenchymal lung scaring and fibrosis CT head generalized cerebral atrophy chronic right occipital lobe infarct CT cervical spine Marked severity multilevel DJD CT abdomen pelvis with contrast bibasilar pulmonary fibrosis, nonobstructing renal calculi bilateral, diverticulosis marked severity aortic atherosclerosis Procalcitonin less than 0.1, UA unremarkable.   Subjective: Aaox self place president, "I dont keep track of all things" On 5l [place] Blood sugar 68 in bmp this am Sitter in place this am  Assessment & Plan: SIRS due to dehydration: With hypothermia tachycardia tachypnea lactic acidosis 3.1: So far no infectious etiology noted with negative procalcitonin blood culture urine culture negative chest x-ray CT abdomen pelvis no infectious etiology.  Suspect from dehydration, improved with IV fluids.  Chest x-ray 1/3 with new airspace opacity in the right lung base suspicious for pneumonia placed on antibiotics  Acute on chronic hypoxic respiratory failure Pulmonary fibrosis: Continue on oxygen-baseline 2 L and has been increased+ to 4 L, continue respiratory support  Right LL pneumonia: Started on ceftriaxone azithromycin 1/323, Cont supplemental oxygen, bronchodilators as needed, incentive spirometry.   Fall at home TIA Chronic right occipital infarct: Anxiety/depression Acute metabolic encephalopathy in the  setting of dementia: Acute encephalopathy likely multifactorial from SIRS dehydration.   Has baseline dementia , had a fall at home without fracture on the imaging.  History of stroke.  Cont fall precaution PT OT supportive care.  Will need SNF  Hypertension: Blood pressure is well controlled.  Not on antihypertensive.  Type 2 diabetes mellitus diet with hypoglycemia: starting IV fluids discontinue insulin A1c 5.8.  Once blood sugar stabilizes can discontinue IV fluids Recent Labs  Lab 05/23/21 0724 05/23/21 1118 05/23/21 1507 05/23/21 2124 05/24/21 0750  GLUCAP 192* 188* 131* 88 88    CKD stage IIIa at baseline currently.  Monitor Recent Labs  Lab 05/19/21 0025 05/20/21 0429 05/21/21 0501 05/23/21 0452 05/24/21 0416  BUN 23 21 31* 21 21  CREATININE 1.29* 1.32* 1.65* 1.20 1.23    HLD CAD: Denies chest pain.  Continue aspirin, and Zetia  Severe malnutrition-augment diet as below Nutrition Problem: Severe Malnutrition Etiology: chronic illness (dementia) Signs/Symptoms: moderate fat depletion, severe fat depletion, moderate muscle depletion, severe muscle depletion Interventions: Ensure Enlive (each supplement provides 350kcal and 20 grams of protein), MVI, Magic cup, Liberalize Diet   DVT prophylaxis: enoxaparin (LOVENOX) injection 40 mg Start: 05/23/21 2200 Code Status:   Code Status: DNR Family Communication: plan of care discussed with patient at bedside. Status is: Inpatient Remains inpatient appropriate because: resting in his room Disposition: Currently not  medically stable for discharge. Anticipated Disposition: SNF. Discussed during rounds this morning TOC working on snf  Objective: Vitals last 24 hrs: Vitals:   05/23/21 2016 05/23/21 2358 05/24/21 0540 05/24/21 0751  BP: 116/71 120/67 120/72 122/78  Pulse: 91 91 87 88  Resp: 16 16 16 20   Temp: 97.8 F (36.6 C) 98 F (36.7 C) 98 F (36.7 C) 98.2 F (36.8 C)  TempSrc:    Oral  SpO2: 100%  100% 100%  100%  Weight:      Height:       Weight change:   Intake/Output Summary (Last 24 hours) at 05/24/2021 0859 Last data filed at 05/24/2021 0600 Gross per 24 hour  Intake 353.97 ml  Output 2050 ml  Net -1696.03 ml   Net IO Since Admission: 987.19 mL [05/24/21 0859]   Physical Examination: General exam: AAox2, weak,older than stated age. HEENT:Oral mucosa moist, Ear/Nose WNL grossly,dentition normal. Respiratory system: B/l diminished with crackles on Base,no use of accessory muscle, non tender. Cardiovascular system: S1 & S2 +No JVD. Gastrointestinal system: Abdomen soft, NT,ND, BS+. Nervous System:Alert, awake, moving extremities. Extremities: edema b/l LE pitting,distal peripheral pulses palpable.  Skin: No rashes, no icterus. MSK: thin muscle bulk, tone, power.  Medications reviewed:  Scheduled Meds:  aspirin  81 mg Oral Daily   enoxaparin (LOVENOX) injection  40 mg Subcutaneous Q24H   ezetimibe  10 mg Oral Daily   feeding supplement  237 mL Oral TID BM   hydrocerin   Topical Daily   mirtazapine  15 mg Oral QHS   multivitamin with minerals  1 tablet Oral Daily   pantoprazole  40 mg Oral Daily   sertraline  50 mg Oral Daily   Continuous Infusions:  azithromycin 500 mg (05/23/21 1404)   cefTRIAXone (ROCEPHIN)  IV 2 g (05/23/21 1413)   dextrose 5 % and 0.9% NaCl 50 mL/hr at 05/24/21 0830    Diet Order             DIET DYS 3 Room service appropriate? Yes; Fluid consistency: Thin  Diet effective now                   Nutrition Problem: Severe Malnutrition Etiology: chronic illness (dementia) Signs/Symptoms: moderate fat depletion, severe fat depletion, moderate muscle depletion, severe muscle depletion Interventions: Ensure Enlive (each supplement provides 350kcal and 20 grams of protein), MVI, Magic cup, Liberalize Diet  Weight change:   Wt Readings from Last 3 Encounters:  05/18/21 54.4 kg  01/31/20 54.2 kg  12/02/19 54.4 kg     Consultants:see note   Procedures:see note Antimicrobials: Anti-infectives (From admission, onward)    Start     Dose/Rate Route Frequency Ordered Stop   05/23/21 1315  cefTRIAXone (ROCEPHIN) 2 g in sodium chloride 0.9 % 100 mL IVPB        2 g 200 mL/hr over 30 Minutes Intravenous Every 24 hours 05/23/21 1227 05/28/21 1314   05/23/21 1315  azithromycin (ZITHROMAX) 500 mg in sodium chloride 0.9 % 250 mL IVPB        500 mg 250 mL/hr over 60 Minutes Intravenous Every 24 hours 05/23/21 1227 05/28/21 1314   05/23/21 1300  levofloxacin (LEVAQUIN) IVPB 750 mg  Status:  Discontinued        750 mg 100 mL/hr over 90 Minutes Intravenous Every 24 hours 05/23/21 1206 05/23/21 1220   05/23/21 1300  levofloxacin (LEVAQUIN) IVPB 750 mg  Status:  Discontinued        750 mg 100 mL/hr over 90 Minutes Intravenous Every 48 hours 05/23/21 1221 05/23/21 1233   05/19/21 0230  ceFEPIme (MAXIPIME) 2 g in sodium chloride 0.9 % 100 mL IVPB        2 g 200 mL/hr over 30 Minutes Intravenous  Once 05/19/21 0226 05/19/21 0329   05/19/21 0230  metroNIDAZOLE (FLAGYL) IVPB 500 mg        500 mg 100 mL/hr over 60 Minutes Intravenous  Once  05/19/21 0226 05/19/21 0501   05/19/21 0230  vancomycin (VANCOCIN) IVPB 1000 mg/200 mL premix        1,000 mg 200 mL/hr over 60 Minutes Intravenous  Once 05/19/21 0226 05/19/21 0658      Culture/Microbiology    Component Value Date/Time   SDES  05/19/2021 0346    URINE, CLEAN CATCH Performed at [facility], [address] [place], [place] [address]    [facility]  05/19/2021 [phone]    NONE Performed at [facility], [address], [place], [place] [address]    CULT  05/19/2021 0346    NO GROWTH Performed at [facility], [place] [address], [place],  [address]    REPTSTATUS 05/20/2021 FINAL 05/19/2021 0346    Other culture-see note  Unresulted Labs (From admission, onward)    None       Data Reviewed: I have personally reviewed following labs and imaging  studies CBC: Recent Labs  Lab 05/19/21 0025 05/20/21 0429 05/24/21 0416  WBC 9.4 7.3 6.2  NEUTROABS 8.3*  --   --   HGB 14.4 13.5 13.1  HCT 44.0 41.5 40.3  MCV 95.4 99.0 96.4  PLT 158 146* [id number]   Basic Metabolic Panel: Recent Labs  Lab 05/19/21 0025 05/20/21 0429 05/21/21 0501 05/23/21 0452 05/24/21 0416  NA 137 137 135 136 139  K 4.1 4.7 4.9 4.2 4.3  CL 105 105 104 110 105  CO2 24 27 23 23 27   GLUCOSE 198* 70 66* 137* 68*  BUN 23 21 31* 21 21  CREATININE 1.29* 1.32* 1.65* 1.20 1.23  CALCIUM 8.7* 8.7* 8.5* 7.9* 8.2*   GFR: Estimated Creatinine Clearance: 34.4 mL/min (by C-G formula based on SCr of 1.23 mg/dL). Liver Function Tests: Recent Labs  Lab 05/19/21 0025 05/21/21 0501 05/23/21 0452 05/24/21 0416  AST 35 175* 69* 54*  ALT 18 130* 75* 63*  ALKPHOS 94 92 87 90  BILITOT 0.9 0.9 0.9 0.9  PROT 6.7 6.0* 5.3* 5.5*  ALBUMIN 3.5 3.0* 2.7* 2.7*   Recent Labs  Lab 05/19/21 0025  LIPASE 52*   No results for input(s): AMMONIA in the last 168 hours. Coagulation Profile: Recent Labs  Lab 05/19/21 0025  INR 1.0   Cardiac Enzymes: No results for input(s): CKTOTAL, CKMB, CKMBINDEX, TROPONINI in the last 168 hours. BNP (last 3 results) No results for input(s): PROBNP in the last 8760 hours. HbA1C: No results for input(s): HGBA1C in the last 72 hours. CBG: Recent Labs  Lab 05/23/21 0724 05/23/21 1118 05/23/21 1507 05/23/21 2124 05/24/21 0750  GLUCAP 192* 188* 131* 88 88   Lipid Profile: No results for input(s): CHOL, HDL, LDLCALC, TRIG, CHOLHDL, LDLDIRECT in the last 72 hours. Thyroid Function Tests: No results for input(s): TSH, T4TOTAL, FREET4, T3FREE, THYROIDAB in the last 72 hours. Anemia Panel: No results for input(s): VITAMINB12, FOLATE, FERRITIN, TIBC, IRON, RETICCTPCT in the last 72 hours. Sepsis Labs: Recent Labs  Lab 05/19/21 0025 05/19/21 0610  PROCALCITON <0.10  --   LATICACIDVEN 3.1* 1.0    Recent Results (from the past 240  hour(s))  Culture, blood (Routine x 2)     Status: None   Collection Time: 05/19/21 12:25 AM   Specimen: BLOOD  Result Value Ref Range Status   Specimen Description BLOOD RIGHT FOREARM  Final   Special Requests   Final    BOTTLES DRAWN AEROBIC AND ANAEROBIC Blood Culture results may not be optimal due to an inadequate volume of blood  received in culture bottles   Culture   Final    NO GROWTH 5 DAYS Performed at [facility], [address] [place], [place] [address]    Report Status 05/24/2021 FINAL  Final  Culture, blood (Routine x 2)     Status: None   Collection Time: 05/19/21 12:25 AM   Specimen: BLOOD  Result Value Ref Range Status   Specimen Description BLOOD RIGHT FOREARM  Final   Special Requests   Final    BOTTLES DRAWN AEROBIC AND ANAEROBIC Blood Culture results may not be optimal due to an inadequate volume of blood received in culture bottles   Culture   Final    NO GROWTH 5 DAYS Performed at [facility], [address] [place], [place] [address]    Report Status 05/24/2021 FINAL  Final  Resp Panel by RT-PCR (Flu A&B, Covid) Nasopharyngeal Swab     Status: None   Collection Time: 05/19/21 12:25 AM   Specimen: Nasopharyngeal Swab; Nasopharyngeal(NP) swabs in vial transport medium  Result Value Ref Range Status   SARS Coronavirus 2 by RT PCR NEGATIVE NEGATIVE Final    Comment: (NOTE) SARS-CoV-2 target nucleic acids are NOT DETECTED.  The SARS-CoV-2 RNA is generally detectable in upper respiratory specimens during the acute phase of infection. The lowest concentration of SARS-CoV-2 viral copies this assay can detect is 138 copies/mL. A negative result does not preclude SARS-Cov-2 infection and should not be used as the sole basis for treatment or other patient management decisions. A negative result may occur with  improper specimen collection/handling, submission of specimen other than nasopharyngeal swab, presence of viral mutation(s) within  the areas targeted by this assay, and inadequate number of viral copies(<138 copies/mL). A negative result must be combined with clinical observations, patient history, and epidemiological information. The expected result is Negative.  Fact Sheet for Patients:  EntrepreneurPulse.com.au  Fact Sheet for Healthcare Providers:  IncredibleEmployment.be  This test is no t yet approved or cleared by the [place] FDA and  has been authorized for detection and/or diagnosis of SARS-CoV-2 by FDA under an Emergency Use Authorization (EUA). This EUA will remain  in effect (meaning this test can be used) for the duration of the COVID-19 declaration under Section 564(b)(1) of the Act, 21 U.S.C.section 360bbb-3(b)(1), unless the authorization is terminated  or revoked sooner.       Influenza A by PCR NEGATIVE NEGATIVE Final   Influenza B by PCR NEGATIVE NEGATIVE Final    Comment: (NOTE) The Xpert Xpress SARS-CoV-2/FLU/RSV plus assay is intended as an aid in the diagnosis of influenza from Nasopharyngeal swab specimens and should not be used as a sole basis for treatment. Nasal washings and aspirates are unacceptable for Xpert Xpress SARS-CoV-2/FLU/RSV testing.  Fact Sheet for Patients: EntrepreneurPulse.com.au  Fact Sheet for Healthcare Providers: IncredibleEmployment.be  This test is not yet approved or cleared by the [place] FDA and has been authorized for detection and/or diagnosis of SARS-CoV-2 by FDA under an Emergency Use Authorization (EUA). This EUA will remain in effect (meaning this test can be used) for the duration of the COVID-19 declaration under Section 564(b)(1) of the Act, 21 U.S.C. section 360bbb-3(b)(1), unless the authorization is terminated or revoked.  Performed at [facility], [address], [place], [place] [address]   Urine Culture     Status: None   Collection Time:  05/19/21  3:46 AM   Specimen: Urine, Clean Catch  Result Value Ref Range Status   Specimen  Description   Final    URINE, CLEAN CATCH Performed at [facility], [address] [place], [place] [address]    Special Requests   Final    NONE Performed at [facility], [address] [place], [place] [address]    Culture   Final    NO GROWTH Performed at [facility], [place] [address] [place],  [address]    Report Status 05/20/2021 FINAL  Final     Radiology Studies: DG Chest Port 1 View  Result Date: 05/23/2021 CLINICAL DATA:  Hypoxemia R09.02 (ICD-10-CM) EXAM: PORTABLE CHEST 1 VIEW COMPARISON:  05/19/2021. FINDINGS: Low lung volumes. Extensive bilateral parenchymal lung scarring/fibrosis. New airspace opacity at the right lung base. No visible pleural effusions or pneumothorax. Cardiomediastinal silhouette is within normal limits. Calcific atherosclerosis of the aorta. IMPRESSION: Extensive bilateral lung scarring fibrosis. New airspace opacity at the right lung base, suspicious for pneumonia. Followup PA and lateral chest X-ray is recommended in 3-4 weeks following trial of antibiotic therapy to ensure resolution and exclude underlying malignancy. These results will be called to the ordering clinician or representative by the Radiologist Assistant, and communication documented in the PACS or [employer]. Electronically Signed   By: Margaretha Sheffield M.D.   On: 05/23/2021 11:05   ECHOCARDIOGRAM COMPLETE  Result Date: 05/24/2021    ECHOCARDIOGRAM REPORT   Patient Name:   Frank Khan [facility] Date of Exam: 05/23/2021 Medical Rec #:  [id number]     Height:       72.0 in Accession #:    [id number]    Weight:       120.0 lb Date of Birth:  1935/03/29      BSA:          1.715 m Patient Age:    28 years      BP:           116/71 mmHg Patient Gender: M             HR:           93 bpm. Exam Location:  [facility] Procedure: 2D Echo, Cardiac Doppler and Color Doppler Indications:      [id number] Acute Diastolic Heart Failure  History:         Patient has no prior history of Echocardiogram examinations.                  TIA; Risk Factors:Hypertension and Diabetes. History of                  COVID-19. Dementia.  Sonographer:     Cresenciano Lick RDCS Referring Phys:  [place] Diagnosing Phys: Nelva Bush MD IMPRESSIONS  1. Left ventricular ejection fraction, by estimation, is 40 to 45%. The left ventricle has mildly decreased function. The left ventricle demonstrates global hypokinesis. Left ventricular diastolic parameters are consistent with Grade I diastolic dysfunction (impaired relaxation). There is the interventricular septum is flattened in diastole ('D' shaped left ventricle), consistent with right ventricular volume overload.  2. Right ventricular systolic function is severely reduced. The right ventricular size is severely enlarged. There is moderately elevated pulmonary artery systolic pressure. The estimated right ventricular systolic pressure is [id number] mmHg.  3. Right atrial size was mildly dilated.  4. The mitral valve is degenerative. Mild mitral valve regurgitation. No evidence of mitral stenosis.  5. Tricuspid valve regurgitation is severe.  6. The aortic valve has an indeterminant number of cusps. There is mild calcification  of the aortic valve. There is moderate thickening of the aortic valve. Aortic valve regurgitation is trivial. There is likely moderate, bordering on severe, low-flow/low-gradient aortic stenosis with mean gradient 6 mmHg, valve area 0.8 cm^2, and dimensionless index 0.29.  7. The inferior vena cava is normal in size with <50% respiratory variability, suggesting right atrial pressure of 8 mmHg. FINDINGS  Left Ventricle: Left ventricular ejection fraction, by estimation, is 40 to 45%. The left ventricle has mildly decreased function. The left ventricle demonstrates global hypokinesis. The left ventricular internal cavity size was normal in size.  There is  no left ventricular hypertrophy. The interventricular septum is flattened in diastole ('D' shaped left ventricle), consistent with right ventricular volume overload. Left ventricular diastolic parameters are consistent with Grade I diastolic dysfunction  (impaired relaxation). Right Ventricle: The right ventricular size is severely enlarged. No increase in right ventricular wall thickness. Right ventricular systolic function is severely reduced. There is moderately elevated pulmonary artery systolic pressure. The tricuspid regurgitant velocity is 3.44 m/s, and with an assumed right atrial pressure of 8 mmHg, the estimated right ventricular systolic pressure is [id number] mmHg. Left Atrium: Left atrial size was normal in size. Right Atrium: Right atrial size was mildly dilated. Pericardium: There is no evidence of pericardial effusion. Mitral Valve: The mitral valve is degenerative in appearance. There is moderate thickening of the mitral valve leaflet(s). There is mild calcification of the mitral valve leaflet(s). Mild to moderate mitral annular calcification. Mild mitral valve regurgitation. No evidence of mitral valve stenosis. Tricuspid Valve: The tricuspid valve is normal in structure. Tricuspid valve regurgitation is severe. The flow in the hepatic veins is reversed during ventricular systole. Aortic Valve: The aortic valve has an indeterminant number of cusps. There is mild calcification of the aortic valve. There is moderate thickening of the aortic valve. Aortic valve regurgitation is trivial. There is likely moderate, bordering on severe, low-flow/low-gradient aortic stenosis with mean gradient 6 mmHg, valve area 0.8 cm^2, and dimensionless index 0.29. Aortic valve mean gradient measures 5.7 mmHg. Aortic valve peak gradient measures 9.0 mmHg. Aortic valve area, by VTI measures 0.82 cm. Pulmonic Valve: The pulmonic valve was not well visualized. Pulmonic valve regurgitation is not visualized. No  evidence of pulmonic stenosis. Aorta: The aortic root is normal in size and structure. Pulmonary Artery: The pulmonary artery is not well seen. Venous: The inferior vena cava is normal in size with less than 50% respiratory variability, suggesting right atrial pressure of 8 mmHg. IAS/Shunts: The interatrial septum was not well visualized.  LEFT VENTRICLE PLAX 2D LVIDd:         4.00 cm   Diastology LVIDs:         2.70 cm   LV e' medial:    4.35 cm/s LV PW:         0.80 cm   LV E/e' medial:  9.1 LV IVS:        0.70 cm   LV e' lateral:   5.22 cm/s LVOT diam:     1.90 cm   LV E/e' lateral: 7.5 LV SV:         21 LV SV Index:   12 LVOT Area:     2.84 cm  RIGHT VENTRICLE            IVC RV Basal diam:  5.30 cm    IVC diam: 1.50 cm RV S prime:     7.18 cm/s TAPSE (M-mode): 0.8 cm LEFT ATRIUM  Index        RIGHT ATRIUM           Index LA diam:        3.70 cm 2.16 cm/m   RA Area:     18.40 cm LA Vol (A2C):   48.1 ml 28.05 ml/m  RA Volume:   59.50 ml  34.70 ml/m LA Vol (A4C):   26.5 ml 15.45 ml/m LA Biplane Vol: 36.1 ml 21.05 ml/m  AORTIC VALVE AV Area (Vmax):    0.85 cm AV Area (Vmean):   0.81 cm AV Area (VTI):     0.82 cm AV Vmax:           150.00 cm/s AV Vmean:          112.667 cm/s AV VTI:            0.255 m AV Peak Grad:      9.0 mmHg AV Mean Grad:      5.7 mmHg LVOT Vmax:         45.23 cm/s LVOT Vmean:        32.000 cm/s LVOT VTI:          0.074 m LVOT/AV VTI ratio: 0.29  AORTA Ao Root diam: 3.50 cm MITRAL VALVE               TRICUSPID VALVE MV Area (PHT): 3.12 cm    TR Peak grad:   47.3 mmHg MV Decel Time: 243 msec    TR Vmax:        344.00 cm/s MV E velocity: 39.40 cm/s MV A velocity: 83.10 cm/s  SHUNTS MV E/A ratio:  0.47        Systemic VTI:  0.07 m                            Systemic Diam: 1.90 cm Nelva Bush MD Electronically signed by Nelva Bush MD Signature Date/Time: 05/24/2021/6:48:21 AM    Final      LOS: 5 days   Antonieta Pert, MD [facility]  05/24/2021, 8:59 AM

## 2021-05-24 NOTE — Progress Notes (Signed)
Occupational Therapy Treatment Patient Details Name: BRITTANY AMIRAULT MRN: [id number] DOB: March 27, 1935 Today's Date: 05/24/2021   History of present illness Trayshawn Durkin is an 86yoM who comes to [facility] on 12/29 after weakness, altered mental status, fall at home. PMH: pulmonary fibrosis on 2 L oxygen, CAD, stent placement, CKD-3A, hypertension, hyperlipidemia, diet-controlled diabetes, TIA, GERD, depression, anxiety. Head CT shows chronic right occipital lobe infarct. Pt has Stg 2 sacral ulcer wound care attributing to moisture/friction etiology mor elikely than pressure injury.   OT comments  Mr Didonato was seen for OT treatment on this date. Upon arrival to room pt reclined in bed.  Pt requires MAX A sup<>sit. MAX A don B socks seated EOB. Fair sitting balance, tolerates ~5 min, no UE support however pt refuses further mobility attempts. Instructed on ECS including PLB. SpO2 82% on 5L [place], increased to 87% on 6L [place] - RN notified. Pt making limited progress toward goals. Pt continues to benefit from skilled OT services to maximize return to PLOF and minimize risk of future falls, injury, caregiver burden, and readmission. Will continue to follow POC. Discharge recommendation remains appropriate.     Recommendations for follow up therapy are one component of a multi-disciplinary discharge planning process, led by the attending physician.  Recommendations may be updated based on patient status, additional functional criteria and insurance authorization.    Follow Up Recommendations  Skilled nursing-short term rehab (<3 hours/day)    Assistance Recommended at Discharge Frequent or constant Supervision/Assistance  Patient can return home with the following  Two people to help with walking and/or transfers;Two people to help with bathing/dressing/bathroom;Help with stairs or ramp for entrance   Equipment Recommendations  Other (comment) (defer to next venue of care)    Recommendations for Other Services       Precautions / Restrictions Precautions Precautions: Fall Restrictions Weight Bearing Restrictions: No       Mobility Bed Mobility Overal bed mobility: Needs Assistance Bed Mobility: Supine to Sit;Sit to Supine;Rolling     Supine to sit: Max assist Sit to supine: Max assist        Transfers                   General transfer comment: pt refuses     Balance Overall balance assessment: Needs assistance Sitting-balance support: No upper extremity supported;Feet unsupported Sitting balance-Leahy Scale: Fair                                     ADL either performed or assessed with clinical judgement   ADL Overall ADL's : Needs assistance/impaired                                       General ADL Comments: MAX A don B socks seated EOB. Fair sitting balance, tolerates ~5 min, no UE support however pt refuses further mobility attempts      Cognition Arousal/Alertness: Awake/alert Behavior During Therapy: WFL for tasks assessed/performed Overall Cognitive Status: History of cognitive impairments - at baseline                                 General Comments: poor motivation                General  Comments SpO2 82% on 5L [place], improved to 87% on 6L [place] RN notified    Pertinent Vitals/ Pain       Pain Assessment: Faces Faces Pain Scale: Hurts little more Pain Location: general malaise with mobility Pain Descriptors / Indicators: Aching;Sore Pain Intervention(s): Limited activity within patient's tolerance;Repositioned         Frequency  Min 2X/week        Progress Toward Goals  OT Goals(current goals can now be found in the care plan section)  Progress towards OT goals: Progressing toward goals  Acute Rehab OT Goals Patient Stated Goal: to lie down OT Goal Formulation: With patient Time For Goal Achievement: 06/02/21 Potential to Achieve Goals: Good ADL Goals Pt Will Perform Grooming: with  min assist;standing Pt Will Perform Lower Body Dressing: with min assist;sitting/lateral leans Pt Will Transfer to Toilet: with min assist;stand pivot transfer;bedside commode  Plan Discharge plan remains appropriate;Frequency remains appropriate    Co-evaluation                 AM-PAC OT "6 Clicks" Daily Activity     Outcome Measure   Help from another person eating meals?: A Little Help from another person taking care of personal grooming?: A Little Help from another person toileting, which includes using toliet, bedpan, or urinal?: A Lot Help from another person bathing (including washing, rinsing, drying)?: A Lot Help from another person to put on and taking off regular upper body clothing?: A Little Help from another person to put on and taking off regular lower body clothing?: A Lot 6 Click Score: 15    End of Session Equipment Utilized During Treatment: Oxygen  OT Visit Diagnosis: Muscle weakness (generalized) (M62.81);Other abnormalities of gait and mobility (R26.89)   Activity Tolerance Patient limited by fatigue;Patient limited by pain   Patient Left in bed;with call bell/phone within reach;with bed alarm set   Nurse Communication Mobility status        Time: 3419-3790 OT Time Calculation (min): 12 min  Charges: OT General Charges $OT Visit: 1 Visit OT Treatments $Self Care/Home Management : 8-22 mins  Kathie Dike, M.S. OTR/L  05/24/21, 2:44 PM  ascom [phone]

## 2021-05-25 LAB — GLUCOSE, CAPILLARY
Glucose-Capillary: 91 mg/dL (ref 70–99)
Glucose-Capillary: 126 mg/dL — ABNORMAL HIGH (ref 70–99)
Glucose-Capillary: 162 mg/dL — ABNORMAL HIGH (ref 70–99)
Glucose-Capillary: 169 mg/dL — ABNORMAL HIGH (ref 70–99)

## 2021-05-25 LAB — CBC
HCT: 38.6 % — ABNORMAL LOW (ref 39.0–52.0)
Hemoglobin: 13 g/dL (ref 13.0–17.0)
MCH: 32.2 pg (ref 26.0–34.0)
MCHC: 33.7 g/dL (ref 30.0–36.0)
MCV: 95.5 fL (ref 80.0–100.0)
Platelets: 147 10*3/uL — ABNORMAL LOW (ref 150–400)
RBC: 4.04 MIL/uL — ABNORMAL LOW (ref 4.22–5.81)
RDW: 13.7 % (ref 11.5–15.5)
WBC: 6.7 10*3/uL (ref 4.0–10.5)
nRBC: 0 % (ref 0.0–0.2)

## 2021-05-25 LAB — BASIC METABOLIC PANEL
Anion gap: 7 (ref 5–15)
BUN: 18 mg/dL (ref 8–23)
CO2: 24 mmol/L (ref 22–32)
Calcium: 8.2 mg/dL — ABNORMAL LOW (ref 8.9–10.3)
Chloride: 106 mmol/L (ref 98–111)
Creatinine, Ser: 1.11 mg/dL (ref 0.61–1.24)
GFR, Estimated: >60 mL/min (ref >60)
Glucose, Bld: 100 mg/dL — ABNORMAL HIGH (ref 70–99)
Potassium: 4.5 mmol/L (ref 3.5–5.1)
Sodium: 137 mmol/L (ref 135–145)

## 2021-05-25 NOTE — Progress Notes (Addendum)
Frank NOTE    DEROLD RAMIREZGARCIA  [id number] DOB: 02-18-1935 DOA: 05/19/2021 PCP: Rusty Aus, MD   Chief Complaint  Patient presents with   Weakness    Brief Narrative/Hospital Course: Frank Khan, 87 y.o. male with PMH significant for dementia, pulmonary fibrosis/chronic hypoxic respiratory failure on 2 L oxygen, CAD- stent placement, CKD-3a, hypertension, hyperlipidemia, diet-controlled diabetes, TIA, GERD, depression, anxiety, presented with weakness altered mental status and fall and admitted for SIRS criteria , lactic acidosis Chest x-ray 12/30 bilateral parenchymal lung scaring and fibrosis CT head generalized cerebral atrophy chronic right occipital lobe infarct CT cervical spine Marked severity multilevel DJD CT abdomen pelvis with contrast bibasilar pulmonary fibrosis, nonobstructing renal calculi bilateral, diverticulosis marked severity aortic atherosclerosis Procalcitonin less than 0.1, UA unremarkable.  Managed ceftriaxone azithromycin supplemental oxygen bronchodilators-noted to have right lower lobe pneumonia on chest x-ray 1/3. At this time oxygen saturation has improved and back to 2 L home setting.  Subjective: Seen this morning, he is on 2 L nasal cannulae improved. Does not feel well today. Blood pressure stable No hypoglycemia  Assessment & Plan: SIRS due to dehydration: With hypothermia tachycardia tachypnea lactic acidosis 3.1: So far no infectious etiology noted with negative procalcitonin blood culture urine culture negative chest x-ray CT abdomen pelvis no infectious etiology on admission.  Patient was treated for dehydration.  Chest x-ray 1/3 with new airspace opacity in the right lung base suspicious for pneumonia-being treated as below  Right LL pneumonia: Managed with ceftriaxone azithromycin Day #3, supplement oxygen improved now on 2 L home setting. Cont supplemental oxygen, bronchodilators as needed, incentive spirometry.  Pro-Cal reassuring  less than 0.1 on 12/30, encourage ambulation Recent Labs  Lab 05/19/21 0025 05/19/21 0610 05/20/21 0429 05/24/21 0416 05/25/21 0419  WBC 9.4  --  7.3 6.2 6.7  LATICACIDVEN 3.1* 1.0  --   --   --   PROCALCITON <0.10  --   --   --   --     Acute on chronic hypoxic respiratory failure Pulmonary fibrosis: Continue on oxygen-baseline 2 L and doing well on 2 this morning.   Fall at home TIA Chronic right occipital infarct: Anxiety/depression Acute metabolic encephalopathy in the setting of dementia: Acute encephalopathy likely multifactorial from SIRS dehydration.   Has baseline dementia , had a fall at home without fracture on the imaging.  History of stroke.  Continue supportive care, fall precaution, PT OT and planning for skilled nursing facility.  Hypertension: well controlled.  Not on antihypertensive.  T2DM with hypoglycemia: Briefly needed IV fluids w/ dextrose 1/4- A1c 5.8 monitor CBG Recent Labs  Lab 05/23/21 2124 05/24/21 0750 05/24/21 1147 05/24/21 1639 05/24/21 2037  GLUCAP 88 88 219* 170* 151*     CKD stage IIIa at baseline currently.  Monitor Recent Labs  Lab 05/20/21 0429 05/21/21 0501 05/23/21 0452 05/24/21 0416 05/25/21 0419  BUN 21 31* 21 21 18   CREATININE 1.32* 1.65* 1.20 1.23 1.11     HLD CAD: No chest pain.  Continue aspirin, and Zetia  Severe malnutrition-augment diet as below Nutrition Problem: Severe Malnutrition Etiology: chronic illness (dementia) Signs/Symptoms: moderate fat depletion, severe fat depletion, moderate muscle depletion, severe muscle depletion Interventions: Ensure Enlive (each supplement provides 350kcal and 20 grams of protein), MVI, Magic cup, Liberalize Diet   DVT prophylaxis: enoxaparin (LOVENOX) injection 40 mg Start: 05/23/21 2200 Code Status:   Code Status: DNR Family Communication: plan of care discussed with patient at bedside. Status is: Inpatient Remains inpatient appropriate  because: resting in his  room Disposition: Currently not  medically stable for discharge. Anticipated Disposition: SNF in next 24 hours  Objective: Vitals last 24 hrs: Vitals:   05/24/21 1557 05/24/21 2011 05/25/21 0034 05/25/21 0509  BP: 104/65 114/72 116/67 113/70  Pulse: 88 92 95 91  Resp: 20 16 18 16   Temp: 97.6 F (36.4 C) 98.2 F (36.8 C) 97.8 F (36.6 C) 97.7 F (36.5 C)  TempSrc:      SpO2: 100% 95% 98% 95%  Weight:      Height:       Weight change:   Intake/Output Summary (Last 24 hours) at 05/25/2021 0729 Last data filed at 05/25/2021 0636 Gross per 24 hour  Intake 886.2 ml  Output 825 ml  Net 61.2 ml    Net IO Since Admission: 1,048.39 mL [05/25/21 0729]   Physical Examination: General exam: AAO baseline dementia able to tell me his name current location is POA,  HEENT:Oral mucosa moist, Ear/Nose WNL grossly, dentition normal. Respiratory system: bilaterally basal crackles, no use of accessory muscle Cardiovascular system: S1 & S2 +, No JVD,. Gastrointestinal system: Abdomen soft,NT,ND, BS+ Nervous System:Alert, awake, moving extremities and grossly nonfocal Extremities: mild LE edema, distal peripheral pulses palpable.  Skin: No rashes,no icterus. MSK: Normal muscle bulk,tone, power   Medications reviewed:  Scheduled Meds:  aspirin  81 mg Oral Daily   enoxaparin (LOVENOX) injection  40 mg Subcutaneous Q24H   ezetimibe  10 mg Oral Daily   feeding supplement  237 mL Oral TID BM   hydrocerin   Topical Daily   mirtazapine  15 mg Oral QHS   multivitamin with minerals  1 tablet Oral Daily   pantoprazole  40 mg Oral Daily   sertraline  50 mg Oral Daily   Continuous Infusions:  azithromycin 500 mg (05/24/21 1328)   cefTRIAXone (ROCEPHIN)  IV 2 g (05/24/21 1326)   dextrose 5 % and 0.9% NaCl Stopped (05/24/21 1653)    Diet Order             DIET DYS 3 Room service appropriate? Yes; Fluid consistency: Thin  Diet effective now                   Nutrition Problem: Severe  Malnutrition Etiology: chronic illness (dementia) Signs/Symptoms: moderate fat depletion, severe fat depletion, moderate muscle depletion, severe muscle depletion Interventions: Ensure Enlive (each supplement provides 350kcal and 20 grams of protein), MVI, Magic cup, Liberalize Diet  Weight change:   Wt Readings from Last 3 Encounters:  05/18/21 54.4 kg  01/31/20 54.2 kg  12/02/19 54.4 kg     Consultants:see note  Procedures:see note Antimicrobials: Anti-infectives (From admission, onward)    Start     Dose/Rate Route Frequency Ordered Stop   05/23/21 1315  cefTRIAXone (ROCEPHIN) 2 g in sodium chloride 0.9 % 100 mL IVPB        2 g 200 mL/hr over 30 Minutes Intravenous Every 24 hours 05/23/21 1227 05/28/21 1314   05/23/21 1315  azithromycin (ZITHROMAX) 500 mg in sodium chloride 0.9 % 250 mL IVPB        500 mg 250 mL/hr over 60 Minutes Intravenous Every 24 hours 05/23/21 1227 05/28/21 1314   05/23/21 1300  levofloxacin (LEVAQUIN) IVPB 750 mg  Status:  Discontinued        750 mg 100 mL/hr over 90 Minutes Intravenous Every 24 hours 05/23/21 1206 05/23/21 1220   05/23/21 1300  levofloxacin (LEVAQUIN) IVPB 750 mg  Status:  Discontinued        750 mg 100 mL/hr over 90 Minutes Intravenous Every 48 hours 05/23/21 1221 05/23/21 1233   05/19/21 0230  ceFEPIme (MAXIPIME) 2 g in sodium chloride 0.9 % 100 mL IVPB        2 g 200 mL/hr over 30 Minutes Intravenous  Once 05/19/21 0226 05/19/21 0329   05/19/21 0230  metroNIDAZOLE (FLAGYL) IVPB 500 mg        500 mg 100 mL/hr over 60 Minutes Intravenous  Once 05/19/21 0226 05/19/21 0501   05/19/21 0230  vancomycin (VANCOCIN) IVPB 1000 mg/200 mL premix        1,000 mg 200 mL/hr over 60 Minutes Intravenous  Once 05/19/21 0226 05/19/21 0658      Culture/Microbiology    Component Value Date/Time   SDES  05/19/2021 0346    URINE, CLEAN CATCH Performed at [facility], [address] [place], [place] [address]    [facility]   05/19/2021 [phone]    NONE Performed at [facility], [address], [place], [place] [address]    CULT  05/19/2021 0346    NO GROWTH Performed at [facility], [place] [address] [place], [place] [address]    REPTSTATUS 05/20/2021 FINAL 05/19/2021 0346    Other culture-see note  Unresulted Labs (From admission, onward)     Start     Ordered   05/25/21 [id number]  Basic metabolic panel  Daily,   R     Question:  Specimen collection method  Answer:  Lab=Lab collect   05/24/21 1231   05/25/21 0500  CBC  Daily,   R     Question:  Specimen collection method  Answer:  Lab=Lab collect   05/24/21 1231            Data Reviewed: I have personally reviewed following labs and imaging studies CBC: Recent Labs  Lab 05/19/21 0025 05/20/21 0429 05/24/21 0416 05/25/21 0419  WBC 9.4 7.3 6.2 6.7  NEUTROABS 8.3*  --   --   --   HGB 14.4 13.5 13.1 13.0  HCT 44.0 41.5 40.3 38.6*  MCV 95.4 99.0 96.4 95.5  PLT 158 146* 150 147*    Basic Metabolic Panel: Recent Labs  Lab 05/20/21 0429 05/21/21 0501 05/23/21 0452 05/24/21 0416 05/25/21 0419  NA 137 135 136 139 137  K 4.7 4.9 4.2 4.3 4.5  CL 105 104 110 105 106  CO2 27 23 23 27 24   GLUCOSE 70 66* 137* 68* 100*  BUN 21 31* 21 21 18   CREATININE 1.32* 1.65* 1.20 1.23 1.11  CALCIUM 8.7* 8.5* 7.9* 8.2* 8.2*    GFR: Estimated Creatinine Clearance: 38.1 mL/min (by C-G formula based on SCr of 1.11 mg/dL). Liver Function Tests: Recent Labs  Lab 05/19/21 0025 05/21/21 0501 05/23/21 0452 05/24/21 0416  AST 35 175* 69* 54*  ALT 18 130* 75* 63*  ALKPHOS 94 92 87 90  BILITOT 0.9 0.9 0.9 0.9  PROT 6.7 6.0* 5.3* 5.5*  ALBUMIN 3.5 3.0* 2.7* 2.7*    Recent Labs  Lab 05/19/21 0025  LIPASE 52*    No results for input(s): AMMONIA in the last 168 hours. Coagulation Profile: Recent Labs  Lab 05/19/21 0025  INR 1.0    Cardiac Enzymes: No results for input(s): CKTOTAL, CKMB, CKMBINDEX, TROPONINI in the last 168  hours. BNP (last 3 results) No results for input(s): PROBNP in the last 8760 hours. HbA1C: No results for input(s): HGBA1C in the last  72 hours. CBG: Recent Labs  Lab 05/23/21 2124 05/24/21 0750 05/24/21 1147 05/24/21 1639 05/24/21 2037  GLUCAP 88 88 219* 170* 151*    Lipid Profile: No results for input(s): CHOL, HDL, LDLCALC, TRIG, CHOLHDL, LDLDIRECT in the last 72 hours. Thyroid Function Tests: No results for input(s): TSH, T4TOTAL, FREET4, T3FREE, THYROIDAB in the last 72 hours. Anemia Panel: No results for input(s): VITAMINB12, FOLATE, FERRITIN, TIBC, IRON, RETICCTPCT in the last 72 hours. Sepsis Labs: Recent Labs  Lab 05/19/21 0025 05/19/21 0610  PROCALCITON <0.10  --   LATICACIDVEN 3.1* 1.0     Recent Results (from the past 240 hour(s))  Culture, blood (Routine x 2)     Status: None   Collection Time: 05/19/21 12:25 AM   Specimen: BLOOD  Result Value Ref Range Status   Specimen Description BLOOD RIGHT FOREARM  Final   Special Requests   Final    BOTTLES DRAWN AEROBIC AND ANAEROBIC Blood Culture results may not be optimal due to an inadequate volume of blood received in culture bottles   Culture   Final    NO GROWTH 5 DAYS Performed at [facility], [address] [place], [place] [address]    Report Status 05/24/2021 FINAL  Final  Culture, blood (Routine x 2)     Status: None   Collection Time: 05/19/21 12:25 AM   Specimen: BLOOD  Result Value Ref Range Status   Specimen Description BLOOD RIGHT FOREARM  Final   Special Requests   Final    BOTTLES DRAWN AEROBIC AND ANAEROBIC Blood Culture results may not be optimal due to an inadequate volume of blood received in culture bottles   Culture   Final    NO GROWTH 5 DAYS Performed at [facility], [address] [place], [place] [address]    Report Status 05/24/2021 FINAL  Final  Resp Panel by RT-PCR (Flu A&B, Covid) Nasopharyngeal Swab     Status: None   Collection Time: 05/19/21 12:25  AM   Specimen: Nasopharyngeal Swab; Nasopharyngeal(NP) swabs in vial transport medium  Result Value Ref Range Status   SARS Coronavirus 2 by RT PCR NEGATIVE NEGATIVE Final    Comment: (NOTE) SARS-CoV-2 target nucleic acids are NOT DETECTED.  The SARS-CoV-2 RNA is generally detectable in upper respiratory specimens during the acute phase of infection. The lowest concentration of SARS-CoV-2 viral copies this assay can detect is 138 copies/mL. A negative result does not preclude SARS-Cov-2 infection and should not be used as the sole basis for treatment or other patient management decisions. A negative result may occur with  improper specimen collection/handling, submission of specimen other than nasopharyngeal swab, presence of viral mutation(s) within the areas targeted by this assay, and inadequate number of viral copies(<138 copies/mL). A negative result must be combined with clinical observations, patient history, and epidemiological information. The expected result is Negative.  Fact Sheet for Patients:  EntrepreneurPulse.com.au  Fact Sheet for Healthcare Providers:  IncredibleEmployment.be  This test is no t yet approved or cleared by the [place] FDA and  has been authorized for detection and/or diagnosis of SARS-CoV-2 by FDA under an Emergency Use Authorization (EUA). This EUA will remain  in effect (meaning this test can be used) for the duration of the COVID-19 declaration under Section 564(b)(1) of the Act, 21 U.S.C.section 360bbb-3(b)(1), unless the authorization is terminated  or revoked sooner.       Influenza A by PCR NEGATIVE NEGATIVE Final   Influenza B by PCR NEGATIVE NEGATIVE  Final    Comment: (NOTE) The Xpert Xpress SARS-CoV-2/FLU/RSV plus assay is intended as an aid in the diagnosis of influenza from Nasopharyngeal swab specimens and should not be used as a sole basis for treatment. Nasal washings and aspirates are  unacceptable for Xpert Xpress SARS-CoV-2/FLU/RSV testing.  Fact Sheet for Patients: EntrepreneurPulse.com.au  Fact Sheet for Healthcare Providers: IncredibleEmployment.be  This test is not yet approved or cleared by the [place] FDA and has been authorized for detection and/or diagnosis of SARS-CoV-2 by FDA under an Emergency Use Authorization (EUA). This EUA will remain in effect (meaning this test can be used) for the duration of the COVID-19 declaration under Section 564(b)(1) of the Act, 21 U.S.C. section 360bbb-3(b)(1), unless the authorization is terminated or revoked.  Performed at [facility], [address] [place], [place] [address]   Urine Culture     Status: None   Collection Time: 05/19/21  3:46 AM   Specimen: Urine, Clean Catch  Result Value Ref Range Status   Specimen Description   Final    URINE, CLEAN CATCH Performed at [facility], [address] [place], [place] [address]    Special Requests   Final    NONE Performed at [facility], [address], [place], [place] [address]    Culture   Final    NO GROWTH Performed at [facility], [place] [address], [place],  [address]    Report Status 05/20/2021 FINAL  Final      Radiology Studies: DG Chest Port 1 View  Result Date: 05/23/2021 CLINICAL DATA:  Hypoxemia R09.02 (ICD-10-CM) EXAM: PORTABLE CHEST 1 VIEW COMPARISON:  05/19/2021. FINDINGS: Low lung volumes. Extensive bilateral parenchymal lung scarring/fibrosis. New airspace opacity at the right lung base. No visible pleural effusions or pneumothorax. Cardiomediastinal silhouette is within normal limits. Calcific atherosclerosis of the aorta. IMPRESSION: Extensive bilateral lung scarring fibrosis. New airspace opacity at the right lung base, suspicious for pneumonia. Followup PA and lateral chest X-ray is recommended in 3-4 weeks following trial of antibiotic therapy to ensure  resolution and exclude underlying malignancy. These results will be called to the ordering clinician or representative by the Radiologist Assistant, and communication documented in the PACS or [employer]. Electronically Signed   By: Margaretha Sheffield M.D.   On: 05/23/2021 11:05   ECHOCARDIOGRAM COMPLETE  Result Date: 05/24/2021    ECHOCARDIOGRAM REPORT   Patient Name:   ELLIE Khan [facility] Date of Exam: 05/23/2021 Medical Rec #:  [id number]     Height:       72.0 in Accession #:    [id number]    Weight:       120.0 lb Date of Birth:  [birth year]      BSA:          1.715 m Patient Age:    50 years      BP:           116/71 mmHg Patient Gender: M             HR:           93 bpm. Exam Location:  [facility] Procedure: 2D Echo, Cardiac Doppler and Color Doppler Indications:     [id number] Acute Diastolic Heart Failure  History:         Patient has no prior history of Echocardiogram examinations.                  TIA; Risk Factors:Hypertension and Diabetes. History of  COVID-19. Dementia.  Sonographer:     Cresenciano Lick RDCS Referring Phys:  [place] Diagnosing Phys: Nelva Bush MD IMPRESSIONS  1. Left ventricular ejection fraction, by estimation, is 40 to 45%. The left ventricle has mildly decreased function. The left ventricle demonstrates global hypokinesis. Left ventricular diastolic parameters are consistent with Grade I diastolic dysfunction (impaired relaxation). There is the interventricular septum is flattened in diastole ('D' shaped left ventricle), consistent with right ventricular volume overload.  2. Right ventricular systolic function is severely reduced. The right ventricular size is severely enlarged. There is moderately elevated pulmonary artery systolic pressure. The estimated right ventricular systolic pressure is [id number] mmHg.  3. Right atrial size was mildly dilated.  4. The mitral valve is degenerative. Mild mitral valve regurgitation. No evidence of mitral stenosis.  5.  Tricuspid valve regurgitation is severe.  6. The aortic valve has an indeterminant number of cusps. There is mild calcification of the aortic valve. There is moderate thickening of the aortic valve. Aortic valve regurgitation is trivial. There is likely moderate, bordering on severe, low-flow/low-gradient aortic stenosis with mean gradient 6 mmHg, valve area 0.8 cm^2, and dimensionless index 0.29.  7. The inferior vena cava is normal in size with <50% respiratory variability, suggesting right atrial pressure of 8 mmHg. FINDINGS  Left Ventricle: Left ventricular ejection fraction, by estimation, is 40 to 45%. The left ventricle has mildly decreased function. The left ventricle demonstrates global hypokinesis. The left ventricular internal cavity size was normal in size. There is  no left ventricular hypertrophy. The interventricular septum is flattened in diastole ('D' shaped left ventricle), consistent with right ventricular volume overload. Left ventricular diastolic parameters are consistent with Grade I diastolic dysfunction  (impaired relaxation). Right Ventricle: The right ventricular size is severely enlarged. No increase in right ventricular wall thickness. Right ventricular systolic function is severely reduced. There is moderately elevated pulmonary artery systolic pressure. The tricuspid regurgitant velocity is 3.44 m/s, and with an assumed right atrial pressure of 8 mmHg, the estimated right ventricular systolic pressure is [id number] mmHg. Left Atrium: Left atrial size was normal in size. Right Atrium: Right atrial size was mildly dilated. Pericardium: There is no evidence of pericardial effusion. Mitral Valve: The mitral valve is degenerative in appearance. There is moderate thickening of the mitral valve leaflet(s). There is mild calcification of the mitral valve leaflet(s). Mild to moderate mitral annular calcification. Mild mitral valve regurgitation. No evidence of mitral valve stenosis. Tricuspid Valve:  The tricuspid valve is normal in structure. Tricuspid valve regurgitation is severe. The flow in the hepatic veins is reversed during ventricular systole. Aortic Valve: The aortic valve has an indeterminant number of cusps. There is mild calcification of the aortic valve. There is moderate thickening of the aortic valve. Aortic valve regurgitation is trivial. There is likely moderate, bordering on severe, low-flow/low-gradient aortic stenosis with mean gradient 6 mmHg, valve area 0.8 cm^2, and dimensionless index 0.29. Aortic valve mean gradient measures 5.7 mmHg. Aortic valve peak gradient measures 9.0 mmHg. Aortic valve area, by VTI measures 0.82 cm. Pulmonic Valve: The pulmonic valve was not well visualized. Pulmonic valve regurgitation is not visualized. No evidence of pulmonic stenosis. Aorta: The aortic root is normal in size and structure. Pulmonary Artery: The pulmonary artery is not well seen. Venous: The inferior vena cava is normal in size with less than 50% respiratory variability, suggesting right atrial pressure of 8 mmHg. IAS/Shunts: The interatrial septum was not well visualized.  LEFT VENTRICLE PLAX 2D LVIDd:  4.00 cm   Diastology LVIDs:         2.70 cm   LV e' medial:    4.35 cm/s LV PW:         0.80 cm   LV E/e' medial:  9.1 LV IVS:        0.70 cm   LV e' lateral:   5.22 cm/s LVOT diam:     1.90 cm   LV E/e' lateral: 7.5 LV SV:         21 LV SV Index:   12 LVOT Area:     2.84 cm  RIGHT VENTRICLE            IVC RV Basal diam:  5.30 cm    IVC diam: 1.50 cm RV S prime:     7.18 cm/s TAPSE (M-mode): 0.8 cm LEFT ATRIUM             Index        RIGHT ATRIUM           Index LA diam:        3.70 cm 2.16 cm/m   RA Area:     18.40 cm LA Vol (A2C):   48.1 ml 28.05 ml/m  RA Volume:   59.50 ml  34.70 ml/m LA Vol (A4C):   26.5 ml 15.45 ml/m LA Biplane Vol: 36.1 ml 21.05 ml/m  AORTIC VALVE AV Area (Vmax):    0.85 cm AV Area (Vmean):   0.81 cm AV Area (VTI):     0.82 cm AV Vmax:           150.00  cm/s AV Vmean:          112.667 cm/s AV VTI:            0.255 m AV Peak Grad:      9.0 mmHg AV Mean Grad:      5.7 mmHg LVOT Vmax:         45.23 cm/s LVOT Vmean:        32.000 cm/s LVOT VTI:          0.074 m LVOT/AV VTI ratio: 0.29  AORTA Ao Root diam: 3.50 cm MITRAL VALVE               TRICUSPID VALVE MV Area (PHT): 3.12 cm    TR Peak grad:   47.3 mmHg MV Decel Time: 243 msec    TR Vmax:        344.00 cm/s MV E velocity: 39.40 cm/s MV A velocity: 83.10 cm/s  SHUNTS MV E/A ratio:  0.47        Systemic VTI:  0.07 m                            Systemic Diam: 1.90 cm Nelva Bush MD Electronically signed by Nelva Bush MD Signature Date/Time: 05/24/2021/6:48:21 AM    Final      LOS: 6 days   Antonieta Pert, MD [facility]  05/25/2021, 7:29 AM

## 2021-05-25 NOTE — Progress Notes (Signed)
Physical Therapy Treatment Patient Details Name: Frank Khan MRN: [id number] DOB: [birth year] Today's Date: 05/25/2021   History of Present Illness Frank Khan is an 87 y.o. M who comes to [facility] on 12/29 after weakness, altered mental status, fall at home. PMH: pulmonary fibrosis on 2 L oxygen, CAD, stent placement, CKD-3A, hypertension, hyperlipidemia, diet-controlled diabetes, TIA, GERD, depression, anxiety. Head CT shows chronic right occipital lobe infarct. Pt has Stg 2 sacral ulcer wound care attributing to moisture/friction etiology more likely than pressure injury.    PT Comments    Pt resting in bed upon PT arrival; pt's nasal cannula noted to be off (placed under pt's chin)--NT reported pt has been taking [place] off today to blow his nose.  Pt cued to put O2 back on but pt did not initiate movement to do this; pt's O2 noted to be 75% on room air so therapist placed O2 back on (2L) although pt initially did not want nasal cannula to be placed back on.  Therapist cued pt for pursed lip breathing in attempt to increase O2 sats (although pt inconsistent with following 1 step cues during session).  O2 sats able to improve to 80-81% on 2 L; required increase of O2 to 3 L to get to 86%; required O2 increased to 4 L to get to 90%; and required O2 increase to 5L to get to 92-93%.  Nurse notified of pt's increased O2 needs and O2 sats.  Pt performed ankle pumps x10 reps B LE's with O2 sats 90% on 5 L and recovered fairly quickly to 92-93%.  Pt performed x10 B LE SAQ's LE exercise in bed and O2 sats decreased to 86% and required about 4-5 minutes to returned to 92% (all on 5 L O2 via nasal cannula).  Deferred further activity d/t O2 desaturation concerns with minimal activity.  Nurse notified of noted O2 concerns.  Will continue to focus on strengthening and progressive functional mobility per pt tolerance.   Recommendations for follow up therapy are one component of a multi-disciplinary discharge planning  process, led by the attending physician.  Recommendations may be updated based on patient status, additional functional criteria and insurance authorization.  Follow Up Recommendations  Skilled nursing-short term rehab (<3 hours/day)     Assistance Recommended at Discharge Frequent or constant Supervision/Assistance  Patient can return home with the following Two people to help with walking and/or transfers;Direct supervision/assist for medications management;Help with stairs or ramp for entrance;A lot of help with bathing/dressing/bathroom;Assistance with cooking/housework;Assist for transportation   Equipment Recommendations  Rolling walker (2 wheels);BSC/3in1;Wheelchair (measurements PT);Wheelchair cushion (measurements PT);Hospital bed    Recommendations for Other Services       Precautions / Restrictions Precautions Precautions: Fall Restrictions Weight Bearing Restrictions: No Other Position/Activity Restrictions: Watch SpO2 closely     Mobility  Bed Mobility               General bed mobility comments: Deferred d/t O2 desaturation with minimal LE ex's in bed.    Transfers                   General transfer comment: Deferred d/t O2 desaturation with minimal LE ex's in bed    Ambulation/Gait                   Stairs             Wheelchair Mobility    Modified Rankin (Stroke Patients Only)       Balance  Cognition Arousal/Alertness: Awake/alert Behavior During Therapy: Flat affect Overall Cognitive Status: No family/caregiver present to determine baseline cognitive functioning                                 General Comments: Decreased motivation noted        Exercises Total Joint Exercises Ankle Circles/Pumps: AROM;Strengthening;Both;10 reps;Supine Short Arc Quad: AROM;Strengthening;Both;10 reps;Supine    General Comments  Nursing cleared pt for  participation in physical therapy.  Pt agreeable to PT session.       Pertinent Vitals/Pain Pain Assessment: Faces Faces Pain Scale: No hurt Pain Intervention(s): Limited activity within patient's tolerance;Monitored during session;Repositioned HR WFL during sessions activities.    Home Living                          Prior Function            PT Goals (current goals can now be found in the care plan section) Acute Rehab PT Goals PT Goal Formulation: Patient unable to participate in goal setting Progress towards PT goals: Not progressing toward goals - comment (d/t O2 desaturation with minimal activity)    Frequency    Min 2X/week      PT Plan Current plan remains appropriate    Co-evaluation              AM-PAC PT "6 Clicks" Mobility   Outcome Measure  Help needed turning from your back to your side while in a flat bed without using bedrails?: A Lot Help needed moving from lying on your back to sitting on the side of a flat bed without using bedrails?: A Lot Help needed moving to and from a bed to a chair (including a wheelchair)?: A Lot Help needed standing up from a chair using your arms (e.g., wheelchair or bedside chair)?: A Lot Help needed to walk in hospital room?: Total Help needed climbing 3-5 steps with a railing? : Total 6 Click Score: 10    End of Session Equipment Utilized During Treatment: Oxygen Activity Tolerance: Other (comment) (limited d/t O2 desaturation with minimal activity) Patient left: in bed;with call bell/phone within reach;with bed alarm set;Other (comment) (B heels floating via pillow support) Nurse Communication: Mobility status;Precautions;Other (comment) (O2 desaturation and O2 needs (increased supplemental O2)) PT Visit Diagnosis: Difficulty in walking, not elsewhere classified (R26.2);Other abnormalities of gait and mobility (R26.89);Muscle weakness (generalized) (M62.81)     Time: 9379-0240 PT Time Calculation  (min) (ACUTE ONLY): 23 min  Charges:  $Therapeutic Exercise: 23-37 mins                    Hendricks Limes, PT 05/25/21, 2:28 PM

## 2021-05-25 NOTE — TOC Progression Note (Addendum)
Transition of Care [facility]) - Progression Note    Patient Details  Name: TUFF CLABO MRN: [id number] Date of Birth: [birth year]  [facility] CM/SW Contact  Margarito Liner, LCSW Phone Number: 05/25/2021, 9:50 AM  Clinical Narrative:   [facility] admissions coordinator confirmed they can accept him under his Medicare part A benefits. Will keep her updated as to when he is medically stable.  11:30 am: Per MD note, anticipated discharge tomorrow. SNF should have a bed as long as no one appeals their discharge. Updated son.   Expected Discharge Plan: Skilled Nursing Facility Barriers to Discharge: SNF Pending bed offer  Expected Discharge Plan and Services Expected Discharge Plan: Skilled Nursing Facility       Living arrangements for the past 2 months: Single Family Home                                       Social Determinants of Health (SDOH) Interventions    Readmission Risk Interventions No flowsheet data found.

## 2021-05-26 LAB — CBC
HCT: 37.8 % — ABNORMAL LOW (ref 39.0–52.0)
Hemoglobin: 12.6 g/dL — ABNORMAL LOW (ref 13.0–17.0)
MCH: 31 pg (ref 26.0–34.0)
MCHC: 33.3 g/dL (ref 30.0–36.0)
MCV: 93.1 fL (ref 80.0–100.0)
Platelets: 155 10*3/uL (ref 150–400)
RBC: 4.06 MIL/uL — ABNORMAL LOW (ref 4.22–5.81)
RDW: 13.8 % (ref 11.5–15.5)
WBC: 6 10*3/uL (ref 4.0–10.5)
nRBC: 0 % (ref 0.0–0.2)

## 2021-05-26 LAB — BASIC METABOLIC PANEL
Anion gap: 5 (ref 5–15)
BUN: 16 mg/dL (ref 8–23)
CO2: 28 mmol/L (ref 22–32)
Calcium: 8.1 mg/dL — ABNORMAL LOW (ref 8.9–10.3)
Chloride: 102 mmol/L (ref 98–111)
Creatinine, Ser: 1.01 mg/dL (ref 0.61–1.24)
GFR, Estimated: >60 mL/min (ref >60)
Glucose, Bld: 78 mg/dL (ref 70–99)
Potassium: 4.1 mmol/L (ref 3.5–5.1)
Sodium: 135 mmol/L (ref 135–145)

## 2021-05-26 LAB — GLUCOSE, CAPILLARY
Glucose-Capillary: 86 mg/dL (ref 70–99)
Glucose-Capillary: 73 mg/dL (ref 70–99)
Glucose-Capillary: 128 mg/dL — ABNORMAL HIGH (ref 70–99)

## 2021-05-26 LAB — RESP PANEL BY RT-PCR (FLU A&B, COVID) ARPGX2
Influenza A by PCR: NEGATIVE
Influenza B by PCR: NEGATIVE
SARS Coronavirus 2 by RT PCR: NEGATIVE

## 2021-05-26 MED ORDER — ADULT MULTIVITAMIN W/MINERALS CH: 1.0000 | ORAL_TABLET | Freq: Every day | ORAL | Status: AC

## 2021-05-26 MED ORDER — ENSURE ENLIVE PO LIQD: 237.0000 mL | Freq: Three times a day (TID) | ORAL | 12 refills | Status: AC

## 2021-05-26 MED ORDER — AZITHROMYCIN 500 MG PO TABS
500.0000 mg | ORAL_TABLET | Freq: Every day | ORAL | 0 refills | Status: AC
Start: 2021-05-26 — End: 2021-05-27

## 2021-05-26 MED ORDER — CEFDINIR 300 MG PO CAPS: 300.0000 mg | ORAL_CAPSULE | Freq: Two times a day (BID) | ORAL | Status: AC

## 2021-05-26 NOTE — Discharge Summary (Signed)
Physician Discharge Summary  Frank Khan [id number] DOB: 07-26-1934 DOA: 05/19/2021  PCP: Rusty Aus, MD  Admit date: 05/19/2021 Discharge date: 06/05/2021  Admitted From: HOME Disposition:  snf  Recommendations for Outpatient Follow-up:  Follow up with PCP in 1-2 weeks Please obtain BMP/CBC in one week Please follow up on the following pending results:  Home Health:no  Equipment/Devices: none  Discharge Condition: Stable Code Status:   Code Status: DNR Diet recommendation:  Diet Order             DIET DYS 3 Room service appropriate? Yes; Fluid consistency: Thin  Diet effective now                 Brief/Interim Summary: 87 y.o. male with PMH significant for dementia, pulmonary fibrosis/chronic hypoxic respiratory failure on 2 L oxygen, CAD- stent placement, CKD-3a, hypertension, hyperlipidemia, diet-controlled diabetes, TIA, GERD, depression, anxiety, presented with weakness altered mental status and fall and admitted for SIRS criteria , lactic acidosis Chest x-ray 12/30 bilateral parenchymal lung scaring and fibrosis CT head generalized cerebral atrophy chronic right occipital lobe infarct CT cervical spine Marked severity multilevel DJD CT abdomen pelvis with contrast bibasilar pulmonary fibrosis, nonobstructing renal calculi bilateral, diverticulosis marked severity aortic atherosclerosis Procalcitonin less than 0.1, UA unremarkable.  Managed ceftriaxone azithromycin supplemental oxygen bronchodilators-noted to have right lower lobe pneumonia on chest x-ray 1/3. At this time oxygen saturation has improved and back to 2 L home setting  Discharge Diagnoses:   SIRS due to dehydration: With hypothermia tachycardia tachypnea lactic acidosis 3.1: So far no infectious etiology noted with negative procalcitonin blood culture urine culture negative chest x-ray CT abdomen pelvis no infectious etiology on admission.  Patient was treated for dehydration.  Chest x-ray 1/3  with new airspace opacity in the right lung base suspicious for pneumonia-being treated as below   Right LL pneumonia: Managed with ceftriaxone azithromycin Day #4 today, supplement oxygen improved now on 2 L home setting. cont bronchodilators as needed, incentive spirometry. Pro-Cal reassuring less than 0.1 on 12/30, encourage ambulation. Will discharge on oral antibiotics  Acute on chronic hypoxic respiratory failure Pulmonary fibrosis: Continue on oxygen-baseline 2 L.    Fall at home TIA Chronic right occipital infarct: Anxiety/depression Acute metabolic encephalopathy in the setting of dementia: Acute encephalopathy likely multifactorial from SIRS dehydration.   Has baseline dementia , had a fall at home without fracture on the imaging.  History of stroke.  Planning for skilled nursing facility continue supportive care fall precaution   Hypertension: well controlled.  Not on antihypertensive.   T2DM with hypoglycemia: Briefly needed IV fluids w/ dextrose 1/4- A1c 5.8 monitor CBG  CKD stage IIIa at baseline currently.  Monitor HLD CAD: No chest pain. Continue aspirin, and Zetia   Severe Malnutrition Etiology: chronic illness (dementia) Signs/Symptoms: moderate fat depletion, severe fat depletion, moderate muscle depletion, severe muscle depletion Interventions: Ensure Enlive (each supplement provides 350kcal and 20 grams of protein), MVI, Magic cup, Liberalize Diet Plan of care discussed with patient's son over the phone agreed for discharge to skilled nursing facility today.  He reports he does not have means to take care of the patient at home and agrees for skilled nursing facility.  Consults: TOC PTOT  Subjective: Alert awake appears stable on 2 L nasal cannula and being fed. Not in distress. Discharge Exam: Vitals:   06/06/2021 0500 06/03/2021 0734  BP: 122/66 127/69  Pulse: 90 90  Resp: 20 18  Temp: 98 F (36.7 C) 98.6 F (  37 C)  SpO2: 98% 98%   General: Pt is  alert, awake, not in acute distress Cardiovascular: RRR, S1/S2 +, no rubs, no gallops Respiratory: CTA bilaterally, no wheezing, no rhonchi Abdominal: Soft, NT, ND, bowel sounds + Extremities: Mild ankle edema, no cyanosis  Discharge Instructions  Discharge Instructions     Discharge instructions   Complete by: As directed    Please call call MD or return to ER for similar or worsening recurring problem that brought you to hospital or if any fever,nausea/vomiting,abdominal pain, uncontrolled pain, chest pain,  shortness of breath or any other alarming symptoms.  Check CBC BMP and chest x-ray 1 week  Please follow-up your doctor as instructed in a week time and call the office for appointment.  Please avoid alcohol, smoking, or any other illicit substance and maintain healthy habits including taking your regular medications as prescribed.  You were cared for by a hospitalist during your hospital stay. If you have any questions about your discharge medications or the care you received while you were in the hospital after you are discharged, you can call the unit and ask to speak with the hospitalist on call if the hospitalist that took care of you is not available.  Once you are discharged, your primary care physician will handle any further medical issues. Please note that NO REFILLS for any discharge medications will be authorized once you are discharged, as it is imperative that you return to your primary care physician (or establish a relationship with a primary care physician if you do not have one) for your aftercare needs so that they can reassess your need for medications and monitor your lab values   Discharge wound care:   Complete by: As directed    Foam dressing to sacrum, change Q 3 days or PRN soiling   Increase activity slowly   Complete by: As directed       Allergies as of 06/03/2021       Reactions   Doxycycline    [facility], reaction not listed    Penicillins  Hives   Has patient had a PCN reaction causing immediate rash, facial/tongue/throat swelling, SOB or lightheadedness with hypotension: yes Has patient had a PCN reaction causing severe rash involving mucus membranes or skin necrosis: no Has patient had a PCN reaction that required hospitalization no Has patient had a PCN reaction occurring within the last 10 years: no If all of the above answers are "NO", then may proceed with Cephalosporin use.   Simvastatin    Per [facility] records, reaction not given   Chlorhexidine Itching   Iodine Rash        Medication List     STOP taking these medications    cetirizine 10 MG tablet Commonly known as: ZYRTEC   ibuprofen 800 MG tablet Commonly known as: ADVIL       TAKE these medications    aspirin 81 MG chewable tablet Chew 81 mg by mouth daily.   azithromycin 500 MG tablet Commonly known as: Zithromax Take 1 tablet (500 mg total) by mouth daily for 1 day. Take 1 tablet daily for 3 days.   cefdinir 300 MG capsule Commonly known as: OMNICEF Take 1 capsule (300 mg total) by mouth 2 (two) times daily for 3 days.   ezetimibe 10 MG tablet Commonly known as: ZETIA Take 10 mg by mouth daily.   feeding supplement Liqd Take 237 mLs by mouth 3 (three) times daily between meals.  hydrOXYzine 25 MG tablet Commonly known as: ATARAX Take 25 mg by mouth 2 (two) times daily as needed for itching.   mirtazapine 7.5 MG tablet Commonly known as: REMERON Take 15 mg by mouth at bedtime.   multivitamin with minerals Tabs tablet Take 1 tablet by mouth daily.   pantoprazole 40 MG tablet Commonly known as: PROTONIX Take 40 mg by mouth daily.   sertraline 50 MG tablet Commonly known as: ZOLOFT Take 50 mg by mouth daily.               Discharge Care Instructions  (From admission, onward)           Start     Ordered   05/30/2021 0000  Discharge wound care:       Comments: Foam dressing to sacrum, change Q 3 days or PRN soiling    05/21/2021 1008            Follow-up Information     Rusty Aus, MD Follow up in 1 week(s).   Specialty: Internal Medicine Contact information: [place] [address] [phone]                Allergies  Allergen Reactions   Doxycycline     [facility] records, reaction not listed    Penicillins Hives    Has patient had a PCN reaction causing immediate rash, facial/tongue/throat swelling, SOB or lightheadedness with hypotension: yes Has patient had a PCN reaction causing severe rash involving mucus membranes or skin necrosis: no Has patient had a PCN reaction that required hospitalization no Has patient had a PCN reaction occurring within the last 10 years: no If all of the above answers are "NO", then may proceed with Cephalosporin use.     Simvastatin     Per [facility] records, reaction not given   Chlorhexidine Itching   Iodine Rash    The results of significant diagnostics from this hospitalization (including imaging, microbiology, ancillary and laboratory) are listed below for reference.    Microbiology: Recent Results (from the past 240 hour(s))  Culture, blood (Routine x 2)     Status: None   Collection Time: 05/19/21 12:25 AM   Specimen: BLOOD  Result Value Ref Range Status   Specimen Description BLOOD RIGHT FOREARM  Final   Special Requests   Final    BOTTLES DRAWN AEROBIC AND ANAEROBIC Blood Culture results may not be optimal due to an inadequate volume of blood received in culture bottles   Culture   Final    NO GROWTH 5 DAYS Performed at [facility], [address] [place], [place] [address]    Report Status 05/24/2021 FINAL  Final  Culture, blood (Routine x 2)     Status: None   Collection Time: 05/19/21 12:25 AM   Specimen: BLOOD  Result Value Ref Range Status   Specimen Description BLOOD RIGHT FOREARM  Final   Special Requests   Final    BOTTLES DRAWN AEROBIC AND ANAEROBIC  Blood Culture results may not be optimal due to an inadequate volume of blood received in culture bottles   Culture   Final    NO GROWTH 5 DAYS Performed at [facility], [address] [place], [place] [address]    Report Status 05/24/2021 FINAL  Final  Resp Panel by RT-PCR (Flu A&B, Covid) Nasopharyngeal Swab     Status: None   Collection Time: 05/19/21 12:25 AM   Specimen: Nasopharyngeal  Swab; Nasopharyngeal(NP) swabs in vial transport medium  Result Value Ref Range Status   SARS Coronavirus 2 by RT PCR NEGATIVE NEGATIVE Final    Comment: (NOTE) SARS-CoV-2 target nucleic acids are NOT DETECTED.  The SARS-CoV-2 RNA is generally detectable in upper respiratory specimens during the acute phase of infection. The lowest concentration of SARS-CoV-2 viral copies this assay can detect is 138 copies/mL. A negative result does not preclude SARS-Cov-2 infection and should not be used as the sole basis for treatment or other patient management decisions. A negative result may occur with  improper specimen collection/handling, submission of specimen other than nasopharyngeal swab, presence of viral mutation(s) within the areas targeted by this assay, and inadequate number of viral copies(<138 copies/mL). A negative result must be combined with clinical observations, patient history, and epidemiological information. The expected result is Negative.  Fact Sheet for Patients:  EntrepreneurPulse.com.au  Fact Sheet for Healthcare Providers:  IncredibleEmployment.be  This test is no t yet approved or cleared by the [place] FDA and  has been authorized for detection and/or diagnosis of SARS-CoV-2 by FDA under an Emergency Use Authorization (EUA). This EUA will remain  in effect (meaning this test can be used) for the duration of the COVID-19 declaration under Section 564(b)(1) of the Act, 21 U.S.C.section 360bbb-3(b)(1), unless the  authorization is terminated  or revoked sooner.       Influenza A by PCR NEGATIVE NEGATIVE Final   Influenza B by PCR NEGATIVE NEGATIVE Final    Comment: (NOTE) The Xpert Xpress SARS-CoV-2/FLU/RSV plus assay is intended as an aid in the diagnosis of influenza from Nasopharyngeal swab specimens and should not be used as a sole basis for treatment. Nasal washings and aspirates are unacceptable for Xpert Xpress SARS-CoV-2/FLU/RSV testing.  Fact Sheet for Patients: EntrepreneurPulse.com.au  Fact Sheet for Healthcare Providers: IncredibleEmployment.be  This test is not yet approved or cleared by the [place] FDA and has been authorized for detection and/or diagnosis of SARS-CoV-2 by FDA under an Emergency Use Authorization (EUA). This EUA will remain in effect (meaning this test can be used) for the duration of the COVID-19 declaration under Section 564(b)(1) of the Act, 21 U.S.C. section 360bbb-3(b)(1), unless the authorization is terminated or revoked.  Performed at [facility], [address], [place], [place] [address]   Urine Culture     Status: None   Collection Time: 05/19/21  3:46 AM   Specimen: Urine, Clean Catch  Result Value Ref Range Status   Specimen Description   Final    URINE, CLEAN CATCH Performed at [facility], [address] [place], [place] [address]    Special Requests   Final    NONE Performed at [facility], [address], [place], [place] [address]    Culture   Final    NO GROWTH Performed at [facility], [place] [address] [place], [place] [address]    Report Status 05/20/2021 FINAL  Final    Procedures/Studies: CT HEAD WO CONTRAST (5MM)  Result Date: 05/19/2021 CLINICAL DATA:  Increasing weakness and falls. EXAM: CT HEAD WITHOUT CONTRAST TECHNIQUE: Contiguous axial images were obtained from the base of the skull through the vertex without intravenous contrast.  COMPARISON:  January 10, 2020 FINDINGS: Brain: There is moderate severity cerebral atrophy with widening of the extra-axial spaces and ventricular dilatation. There are areas of decreased attenuation within the white matter tracts of the supratentorial brain, consistent with microvascular disease changes. A chronic right occipital lobe  infarct is noted. A punctate calcification is again seen within the central pons. Vascular: No hyperdense vessel or unexpected calcification. Skull: Normal. Negative for fracture or focal lesion. Sinuses/Orbits: No acute finding. Other: None. IMPRESSION: 1. Generalized cerebral atrophy. 2. Chronic right occipital lobe infarct. 3. No acute intracranial abnormality. Electronically Signed   By: Virgina Norfolk M.D.   On: 05/19/2021 03:06   CT Cervical Spine Wo Contrast  Result Date: 05/19/2021 CLINICAL DATA:  Increasing weakness and falls. EXAM: CT CERVICAL SPINE WITHOUT CONTRAST TECHNIQUE: Multidetector CT imaging of the cervical spine was performed without intravenous contrast. Multiplanar CT image reconstructions were also generated. COMPARISON:  August 02, 2019 FINDINGS: Alignment: Stable, approximately 1 mm retrolisthesis of the C4 vertebral body is noted on C5. Skull base and vertebrae: No acute fracture. No primary bone lesion or focal pathologic process. Soft tissues and spinal canal: No prevertebral fluid or swelling. No visible canal hematoma. Disc levels: Mild to moderate severity endplate sclerosis is seen at the level of C3-C4 with moderate to marked severity endplate sclerosis seen at the levels of C4-C5 and C5-C6. There is marked severity narrowing of the anterior atlantoaxial articulation. Mild intervertebral disc space narrowing is seen at the level of C2-C3. Marked severity intervertebral disc space narrowing is seen at the levels of C4-C5 and C5-C6 and along the posterior aspect of the C3-C4 level. Bilateral moderate to marked severity multilevel facet joint  hypertrophy is noted. Upper chest: There is mild biapical scarring and/or atelectasis. Other: None. IMPRESSION: 1. Marked severity multilevel degenerative changes, most prominent at the levels of C4-C5 and C5-C6. 2. No evidence of an acute cervical spine fracture. Electronically Signed   By: Virgina Norfolk M.D.   On: 05/19/2021 03:13   CT ABDOMEN PELVIS W CONTRAST  Result Date: 05/19/2021 CLINICAL DATA:  Weakness and falls. EXAM: CT ABDOMEN AND PELVIS WITH CONTRAST TECHNIQUE: Multidetector CT imaging of the abdomen and pelvis was performed using the standard protocol following bolus administration of intravenous contrast. CONTRAST:  52mL OMNIPAQUE IOHEXOL 300 MG/ML  SOLN COMPARISON:  November 08, 2019 FINDINGS: Lower chest: Changes consistent with pulmonary fibrosis are again seen within the bilateral lung bases. A very small right pleural effusion is seen. Hepatobiliary: No focal liver abnormality is seen. The gallbladder is not clearly identified. Pancreas: Unremarkable. No pancreatic ductal dilatation or surrounding inflammatory changes. Spleen: Normal in size without focal abnormality. Adrenals/Urinary Tract: Adrenal glands are unremarkable. The right kidney is normal in size. Atrophic changes and cortical thinning are seen along the anterior and lateral aspects of the mid to lower left kidney. 2 mm nonobstructing renal calculi are seen within the posterior aspect of the mid right kidney and anterolateral aspect of the mid to upper left kidney. Bladder is unremarkable. Stomach/Bowel: Stomach is within normal limits. The appendix is not visualized. No evidence of bowel dilatation. Stool is seen throughout the colon. Noninflamed diverticula are seen throughout the large bowel. Vascular/Lymphatic: Marked severity aortic atherosclerosis. No enlarged abdominal or pelvic lymph nodes. Reproductive: The prostate gland is mildly enlarged. Other: No abdominal wall hernia or abnormality. No abdominopelvic ascites.  Musculoskeletal: Multilevel degenerative changes seen throughout the lumbar spine. IMPRESSION: 1. Findings consistent with bibasilar pulmonary fibrosis. 2. Very small right pleural effusion. 3. Bilateral nonobstructing renal calculi. 4. Atrophic changes and cortical thinning of the mid to lower left kidney. 5. Colonic diverticulosis. 6. Marked severity aortic atherosclerosis. Aortic Atherosclerosis (ICD10-I70.0). Electronically Signed   By: Virgina Norfolk M.D.   On: 05/19/2021 03:21   DG  Chest Port 1 View  Result Date: 05/23/2021 CLINICAL DATA:  Hypoxemia R09.02 (ICD-10-CM) EXAM: PORTABLE CHEST 1 VIEW COMPARISON:  05/19/2021. FINDINGS: Low lung volumes. Extensive bilateral parenchymal lung scarring/fibrosis. New airspace opacity at the right lung base. No visible pleural effusions or pneumothorax. Cardiomediastinal silhouette is within normal limits. Calcific atherosclerosis of the aorta. IMPRESSION: Extensive bilateral lung scarring fibrosis. New airspace opacity at the right lung base, suspicious for pneumonia. Followup PA and lateral chest X-ray is recommended in 3-4 weeks following trial of antibiotic therapy to ensure resolution and exclude underlying malignancy. These results will be called to the ordering clinician or representative by the Radiologist Assistant, and communication documented in the PACS or [employer]. Electronically Signed   By: Margaretha Sheffield M.D.   On: 05/23/2021 11:05   DG Chest Port 1 View  Result Date: 05/19/2021 CLINICAL DATA:  Increasing weakness, multiple falls, confusion EXAM: PORTABLE CHEST 1 VIEW COMPARISON:  08/02/2019 FINDINGS: Single frontal view of the chest demonstrates an unremarkable cardiac silhouette. Stable bilateral parenchymal scarring and fibrosis, greatest at the bases. No acute airspace disease, effusion, or pneumothorax. No acute bony abnormalities. IMPRESSION: 1. Bilateral parenchymal lung scarring and fibrosis. No acute airspace disease.  Electronically Signed   By: Randa Ngo M.D.   On: 05/19/2021 00:25   ECHOCARDIOGRAM COMPLETE  Result Date: 05/24/2021    ECHOCARDIOGRAM REPORT   Patient Name:   Frank Khan [facility] Date of Exam: 05/23/2021 Medical Rec #:  [id number]     Height:       72.0 in Accession #:    [id number]    Weight:       120.0 lb Date of Birth:  1934/12/12      BSA:          1.715 m Patient Age:    87 years      BP:           116/71 mmHg Patient Gender: M             HR:           93 bpm. Exam Location:  [facility] Procedure: 2D Echo, Cardiac Doppler and Color Doppler Indications:     [id number] Acute Diastolic Heart Failure  History:         Patient has no prior history of Echocardiogram examinations.                  TIA; Risk Factors:Hypertension and Diabetes. History of                  COVID-19. Dementia.  Sonographer:     Cresenciano Lick RDCS Referring Phys:  [place] Diagnosing Phys: Nelva Bush MD IMPRESSIONS  1. Left ventricular ejection fraction, by estimation, is 40 to 45%. The left ventricle has mildly decreased function. The left ventricle demonstrates global hypokinesis. Left ventricular diastolic parameters are consistent with Grade I diastolic dysfunction (impaired relaxation). There is the interventricular septum is flattened in diastole ('D' shaped left ventricle), consistent with right ventricular volume overload.  2. Right ventricular systolic function is severely reduced. The right ventricular size is severely enlarged. There is moderately elevated pulmonary artery systolic pressure. The estimated right ventricular systolic pressure is [id number] mmHg.  3. Right atrial size was mildly dilated.  4. The mitral valve is degenerative. Mild mitral valve regurgitation. No evidence of mitral stenosis.  5. Tricuspid valve regurgitation is severe.  6. The aortic valve has an indeterminant number of cusps. There is mild calcification of  the aortic valve. There is moderate thickening of the aortic valve. Aortic valve  regurgitation is trivial. There is likely moderate, bordering on severe, low-flow/low-gradient aortic stenosis with mean gradient 6 mmHg, valve area 0.8 cm^2, and dimensionless index 0.29.  7. The inferior vena cava is normal in size with <50% respiratory variability, suggesting right atrial pressure of 8 mmHg. FINDINGS  Left Ventricle: Left ventricular ejection fraction, by estimation, is 40 to 45%. The left ventricle has mildly decreased function. The left ventricle demonstrates global hypokinesis. The left ventricular internal cavity size was normal in size. There is  no left ventricular hypertrophy. The interventricular septum is flattened in diastole ('D' shaped left ventricle), consistent with right ventricular volume overload. Left ventricular diastolic parameters are consistent with Grade I diastolic dysfunction  (impaired relaxation). Right Ventricle: The right ventricular size is severely enlarged. No increase in right ventricular wall thickness. Right ventricular systolic function is severely reduced. There is moderately elevated pulmonary artery systolic pressure. The tricuspid regurgitant velocity is 3.44 m/s, and with an assumed right atrial pressure of 8 mmHg, the estimated right ventricular systolic pressure is [id number] mmHg. Left Atrium: Left atrial size was normal in size. Right Atrium: Right atrial size was mildly dilated. Pericardium: There is no evidence of pericardial effusion. Mitral Valve: The mitral valve is degenerative in appearance. There is moderate thickening of the mitral valve leaflet(s). There is mild calcification of the mitral valve leaflet(s). Mild to moderate mitral annular calcification. Mild mitral valve regurgitation. No evidence of mitral valve stenosis. Tricuspid Valve: The tricuspid valve is normal in structure. Tricuspid valve regurgitation is severe. The flow in the hepatic veins is reversed during ventricular systole. Aortic Valve: The aortic valve has an indeterminant  number of cusps. There is mild calcification of the aortic valve. There is moderate thickening of the aortic valve. Aortic valve regurgitation is trivial. There is likely moderate, bordering on severe, low-flow/low-gradient aortic stenosis with mean gradient 6 mmHg, valve area 0.8 cm^2, and dimensionless index 0.29. Aortic valve mean gradient measures 5.7 mmHg. Aortic valve peak gradient measures 9.0 mmHg. Aortic valve area, by VTI measures 0.82 cm. Pulmonic Valve: The pulmonic valve was not well visualized. Pulmonic valve regurgitation is not visualized. No evidence of pulmonic stenosis. Aorta: The aortic root is normal in size and structure. Pulmonary Artery: The pulmonary artery is not well seen. Venous: The inferior vena cava is normal in size with less than 50% respiratory variability, suggesting right atrial pressure of 8 mmHg. IAS/Shunts: The interatrial septum was not well visualized.  LEFT VENTRICLE PLAX 2D LVIDd:         4.00 cm   Diastology LVIDs:         2.70 cm   LV e' medial:    4.35 cm/s LV PW:         0.80 cm   LV E/e' medial:  9.1 LV IVS:        0.70 cm   LV e' lateral:   5.22 cm/s LVOT diam:     1.90 cm   LV E/e' lateral: 7.5 LV SV:         21 LV SV Index:   12 LVOT Area:     2.84 cm  RIGHT VENTRICLE            IVC RV Basal diam:  5.30 cm    IVC diam: 1.50 cm RV S prime:     7.18 cm/s TAPSE (M-mode): 0.8 cm LEFT ATRIUM  Index        RIGHT ATRIUM           Index LA diam:        3.70 cm 2.16 cm/m   RA Area:     18.40 cm LA Vol (A2C):   48.1 ml 28.05 ml/m  RA Volume:   59.50 ml  34.70 ml/m LA Vol (A4C):   26.5 ml 15.45 ml/m LA Biplane Vol: 36.1 ml 21.05 ml/m  AORTIC VALVE AV Area (Vmax):    0.85 cm AV Area (Vmean):   0.81 cm AV Area (VTI):     0.82 cm AV Vmax:           150.00 cm/s AV Vmean:          112.667 cm/s AV VTI:            0.255 m AV Peak Grad:      9.0 mmHg AV Mean Grad:      5.7 mmHg LVOT Vmax:         45.23 cm/s LVOT Vmean:        32.000 cm/s LVOT VTI:          0.074  m LVOT/AV VTI ratio: 0.29  AORTA Ao Root diam: 3.50 cm MITRAL VALVE               TRICUSPID VALVE MV Area (PHT): 3.12 cm    TR Peak grad:   47.3 mmHg MV Decel Time: 243 msec    TR Vmax:        344.00 cm/s MV E velocity: 39.40 cm/s MV A velocity: 83.10 cm/s  SHUNTS MV E/A ratio:  0.47        Systemic VTI:  0.07 m                            Systemic Diam: 1.90 cm Nelva Bush MD Electronically signed by Nelva Bush MD Signature Date/Time: 05/24/2021/6:48:21 AM    Final     Labs: BNP (last 3 results) No results for input(s): BNP in the last 8760 hours. Basic Metabolic Panel: Recent Labs  Lab 05/21/21 0501 05/23/21 0452 05/24/21 0416 05/25/21 0419 06/17/2021 0658  NA 135 136 139 137 135  K 4.9 4.2 4.3 4.5 4.1  CL 104 110 105 106 102  CO2 23 23 27 24 28   GLUCOSE 66* 137* 68* 100* 78  BUN 31* 21 21 18 16   CREATININE 1.65* 1.20 1.23 1.11 1.01  CALCIUM 8.5* 7.9* 8.2* 8.2* 8.1*   Liver Function Tests: Recent Labs  Lab 05/21/21 0501 05/23/21 0452 05/24/21 0416  AST 175* 69* 54*  ALT 130* 75* 63*  ALKPHOS 92 87 90  BILITOT 0.9 0.9 0.9  PROT 6.0* 5.3* 5.5*  ALBUMIN 3.0* 2.7* 2.7*   No results for input(s): LIPASE, AMYLASE in the last 168 hours. No results for input(s): AMMONIA in the last 168 hours. CBC: Recent Labs  Lab 05/20/21 0429 05/24/21 0416 05/25/21 0419 05/23/2021 0658  WBC 7.3 6.2 6.7 6.0  HGB 13.5 13.1 13.0 12.6*  HCT 41.5 40.3 38.6* 37.8*  MCV 99.0 96.4 95.5 93.1  PLT 146* 150 147* 155   Cardiac Enzymes: No results for input(s): CKTOTAL, CKMB, CKMBINDEX, TROPONINI in the last 168 hours. BNP: Invalid input(s): POCBNP CBG: Recent Labs  Lab 05/25/21 1119 05/25/21 1619 05/25/21 2034 06/13/2021 0432 06/09/2021 0758  GLUCAP 126* 162* 169* 86 73   D-Dimer No results for input(s): DDIMER in  the last 72 hours. Hgb A1c No results for input(s): HGBA1C in the last 72 hours. Lipid Profile No results for input(s): CHOL, HDL, LDLCALC, TRIG, CHOLHDL, LDLDIRECT in  the last 72 hours. Thyroid function studies No results for input(s): TSH, T4TOTAL, T3FREE, THYROIDAB in the last 72 hours.  Invalid input(s): FREET3 Anemia work up No results for input(s): VITAMINB12, FOLATE, FERRITIN, TIBC, IRON, RETICCTPCT in the last 72 hours. Urinalysis    Component Value Date/Time   COLORURINE YELLOW (A) 05/19/2021 0346   APPEARANCEUR HAZY (A) 05/19/2021 0346   LABSPEC 1.031 (H) 05/19/2021 0346   PHURINE 5.0 05/19/2021 0346   GLUCOSEU NEGATIVE 05/19/2021 0346   HGBUR NEGATIVE 05/19/2021 0346   BILIRUBINUR NEGATIVE 05/19/2021 0346   KETONESUR NEGATIVE 05/19/2021 0346   PROTEINUR 30 (A) 05/19/2021 0346   NITRITE NEGATIVE 05/19/2021 0346   LEUKOCYTESUR NEGATIVE 05/19/2021 0346   Sepsis Labs Invalid input(s): PROCALCITONIN,  WBC,  LACTICIDVEN Microbiology Recent Results (from the past 240 hour(s))  Culture, blood (Routine x 2)     Status: None   Collection Time: 05/19/21 12:25 AM   Specimen: BLOOD  Result Value Ref Range Status   Specimen Description BLOOD RIGHT FOREARM  Final   Special Requests   Final    BOTTLES DRAWN AEROBIC AND ANAEROBIC Blood Culture results may not be optimal due to an inadequate volume of blood received in culture bottles   Culture   Final    NO GROWTH 5 DAYS Performed at [facility], [address] [place], [place] [address]    Report Status 05/24/2021 FINAL  Final  Culture, blood (Routine x 2)     Status: None   Collection Time: 05/19/21 12:25 AM   Specimen: BLOOD  Result Value Ref Range Status   Specimen Description BLOOD RIGHT FOREARM  Final   Special Requests   Final    BOTTLES DRAWN AEROBIC AND ANAEROBIC Blood Culture results may not be optimal due to an inadequate volume of blood received in culture bottles   Culture   Final    NO GROWTH 5 DAYS Performed at [facility], [address] [place], [place] [address]    Report Status 05/24/2021 FINAL  Final  Resp Panel by RT-PCR (Flu A&B, Covid)  Nasopharyngeal Swab     Status: None   Collection Time: 05/19/21 12:25 AM   Specimen: Nasopharyngeal Swab; Nasopharyngeal(NP) swabs in vial transport medium  Result Value Ref Range Status   SARS Coronavirus 2 by RT PCR NEGATIVE NEGATIVE Final    Comment: (NOTE) SARS-CoV-2 target nucleic acids are NOT DETECTED.  The SARS-CoV-2 RNA is generally detectable in upper respiratory specimens during the acute phase of infection. The lowest concentration of SARS-CoV-2 viral copies this assay can detect is 138 copies/mL. A negative result does not preclude SARS-Cov-2 infection and should not be used as the sole basis for treatment or other patient management decisions. A negative result may occur with  improper specimen collection/handling, submission of specimen other than nasopharyngeal swab, presence of viral mutation(s) within the areas targeted by this assay, and inadequate number of viral copies(<138 copies/mL). A negative result must be combined with clinical observations, patient history, and epidemiological information. The expected result is Negative.  Fact Sheet for Patients:  EntrepreneurPulse.com.au  Fact Sheet for Healthcare Providers:  IncredibleEmployment.be  This test is no t yet approved or cleared by the [place] FDA and  has been authorized for detection and/or diagnosis of SARS-CoV-2 by FDA under an Emergency Use Authorization (EUA).  This EUA will remain  in effect (meaning this test can be used) for the duration of the COVID-19 declaration under Section 564(b)(1) of the Act, 21 U.S.C.section 360bbb-3(b)(1), unless the authorization is terminated  or revoked sooner.       Influenza A by PCR NEGATIVE NEGATIVE Final   Influenza B by PCR NEGATIVE NEGATIVE Final    Comment: (NOTE) The Xpert Xpress SARS-CoV-2/FLU/RSV plus assay is intended as an aid in the diagnosis of influenza from Nasopharyngeal swab specimens and should not be  used as a sole basis for treatment. Nasal washings and aspirates are unacceptable for Xpert Xpress SARS-CoV-2/FLU/RSV testing.  Fact Sheet for Patients: EntrepreneurPulse.com.au  Fact Sheet for Healthcare Providers: IncredibleEmployment.be  This test is not yet approved or cleared by the [place] FDA and has been authorized for detection and/or diagnosis of SARS-CoV-2 by FDA under an Emergency Use Authorization (EUA). This EUA will remain in effect (meaning this test can be used) for the duration of the COVID-19 declaration under Section 564(b)(1) of the Act, 21 U.S.C. section 360bbb-3(b)(1), unless the authorization is terminated or revoked.  Performed at [facility], [address] [place], [place] [address]   Urine Culture     Status: None   Collection Time: 05/19/21  3:46 AM   Specimen: Urine, Clean Catch  Result Value Ref Range Status   Specimen Description   Final    URINE, CLEAN CATCH Performed at [facility], [address] [place], [place] [address]    Special Requests   Final    NONE Performed at [facility], [address], [place], [place] [address]    Culture   Final    NO GROWTH Performed at [facility], [place] [address], [place], [place] [address]    Report Status 05/20/2021 FINAL  Final     Time coordinating discharge: 35 minutes  SIGNED: Antonieta Pert, MD  [facility] 06/10/2021, 10:08 AM  If 7PM-7AM, please contact night-coverage [email or web address]

## 2021-05-26 NOTE — Plan of Care (Signed)
Patient discharged per MD orders at this time.All discharge instructions,education and medications reviewed with the patient at the bedside.patient expressed understanding and will comply with dc instructions.follow up appointments was also communicated to the patient.no verbal c/o or any ssx of distress.patient was [facility]/OT services per order.report was called to [facility] nurse before discharge.patient was transported by 2 EMS personnel on a stretcher.

## 2021-05-26 NOTE — TOC Progression Note (Addendum)
Transition of Care [facility]) - Progression Note    Patient Details  Name: Frank Khan MRN: [id number] Date of Birth: 08-18-1934  Transition of Care [facility] CM/SW [place], LCSW Phone Number: 06/06/2021, 8:42 AM  Clinical Narrative:   [name] admissions coordinator confirmed they will have a bed today. Son going to facility at 10:00 to complete admissions paperwork. Sent secure chat to MD to notify. Will need rapid COVID test.  11:14 am: COVID results negative. Will set up transport once RN confirms this can be done.  Expected Discharge Plan: Skilled Nursing Facility Barriers to Discharge: SNF Pending bed offer  Expected Discharge Plan and Services Expected Discharge Plan: [place] arrangements for the past 2 months: Single Family Home                                       Social Determinants of Health (SDOH) Interventions    Readmission Risk Interventions No flowsheet data found.

## 2021-05-26 NOTE — Plan of Care (Signed)

## 2021-05-26 NOTE — TOC Transition Note (Signed)
Transition of Care [facility]) - CM/SW Discharge Note   Patient Details  Name: Frank Khan MRN: [id number] Date of Birth: [birth year]  [facility] CM/SW Contact:  Candie Chroman, LCSW Phone Number: 06/11/2021, 12:15 PM   Clinical Narrative:   Patient has orders to discharge home today. Patient with some reservations about going to rehab and wants to go home. CSW called patient's son so he could speak to him on speaker phone. Son explained to him that they cannot take care of him at home right now with how weak he is. Both CSW and son explained to him that he needs to get stronger before he can return home. RN has already called report. EMS transport has been arranged and he is 4th on the list. No further concerns. CSW signing off.  Final next level of care: [place] Barriers to Discharge: Barriers Resolved   Patient Goals and CMS Choice     Choice offered to / list presented to : Patient, Adult Children  Discharge Placement PASRR number recieved: 05/22/21            Patient [facility] bed at: [facility] Patient to be transferred to facility by: EMS Name of family member notified: Cyric Wilfert Patient and family notified of of transfer: 06/07/2021  Discharge Plan and Services                                     Social Determinants of Health (SDOH) Interventions     Readmission Risk Interventions No flowsheet data found.

## 2021-06-20 IMAGING — CT CT HEAD W/O CM
3 series · 16 of 47 positions shown, 19 images · non-contrast
Comparison: Head CT yesterday.

CLINICAL DATA: Head trauma, headache Syncope, fall, hit head, R
eyebrow lac

Fall striking head, less than 1 hour post hospital discharge.
EXAM:
CT HEAD WITHOUT CONTRAST
TECHNIQUE: Contiguous axial images were obtained from the base of the skull
through the vertex without intravenous contrast.

[Series 3: head wo · axial · 0.42mm/px · z∈[-94,+31]mm · 10 of 31 slices shown, 13 images]
[im 3/31  brain]
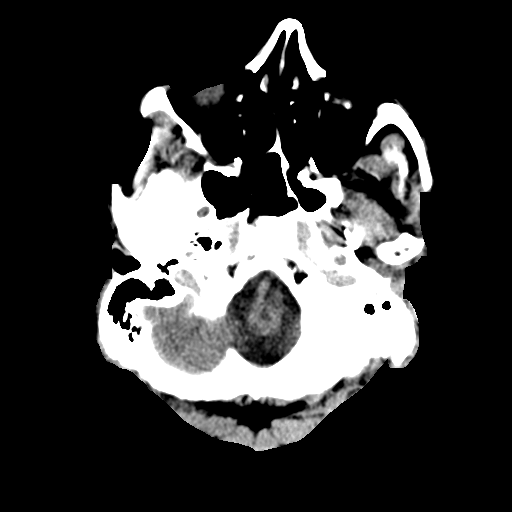
[im 3/31  bone]
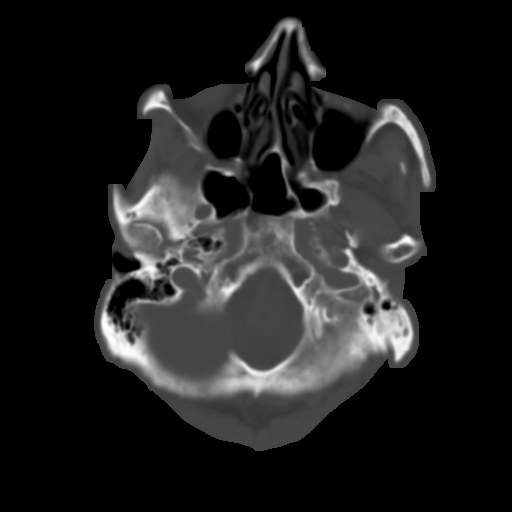
[im 6/31  brain]
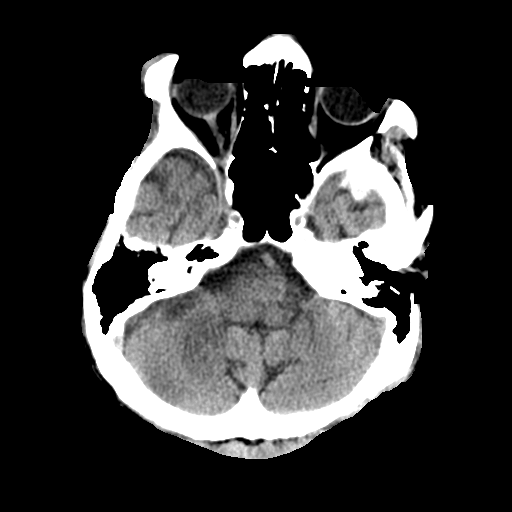
[im 9/31  brain]
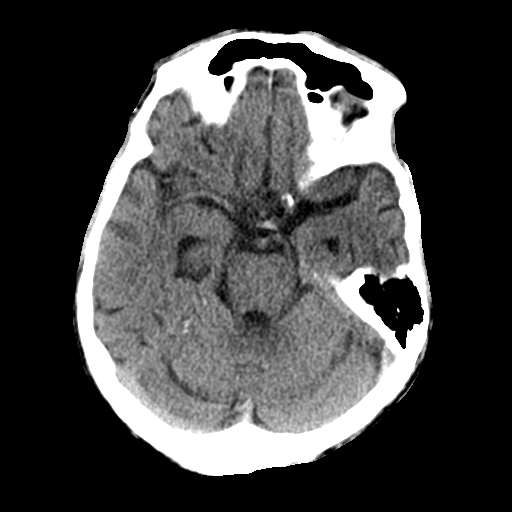
[im 11/31  brain]
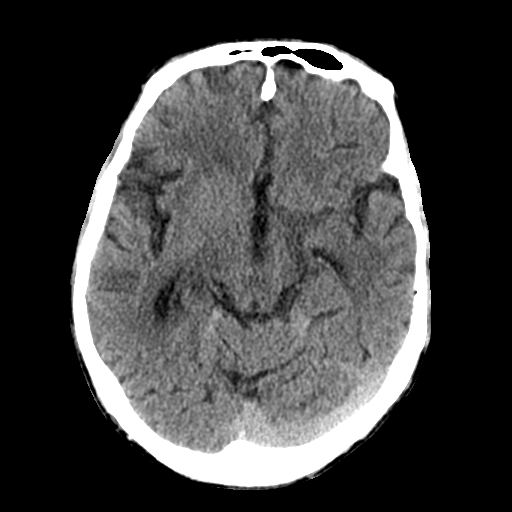
[im 14/31  brain]
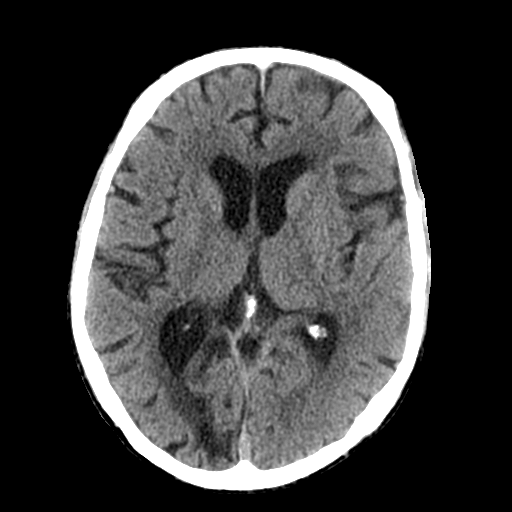
[im 14/31  bone]
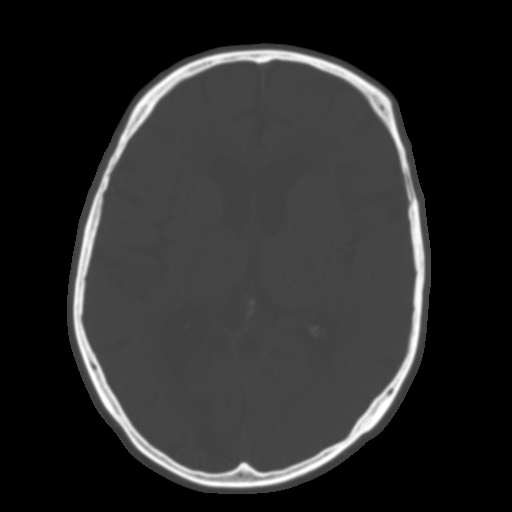
[im 17/31  brain]
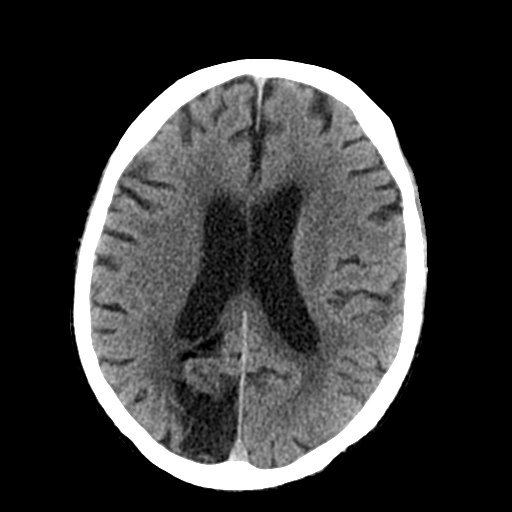
[im 20/31  brain]
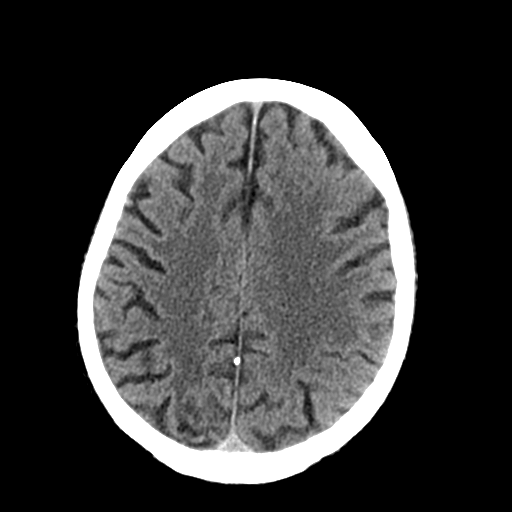
[im 23/31  brain]
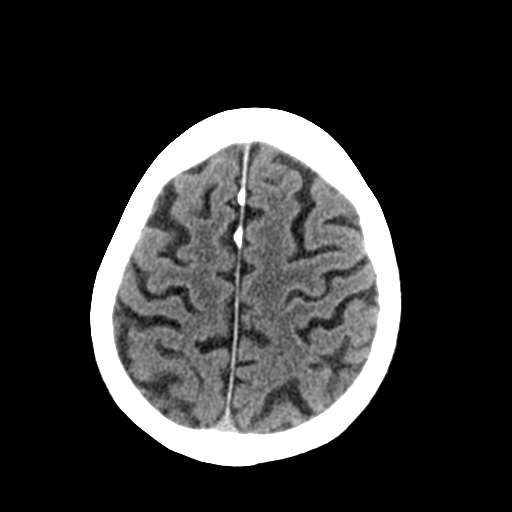
[im 25/31  brain]
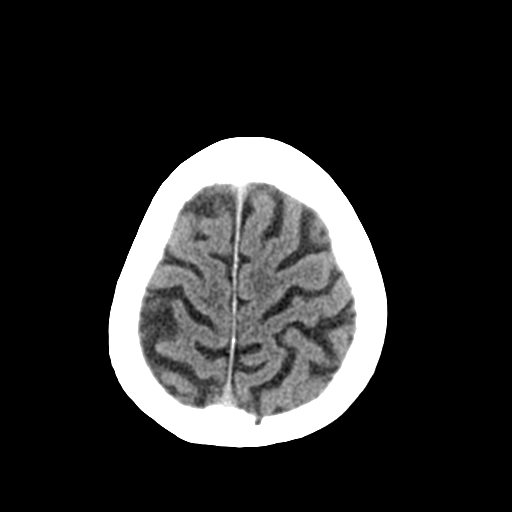
[im 25/31  bone]
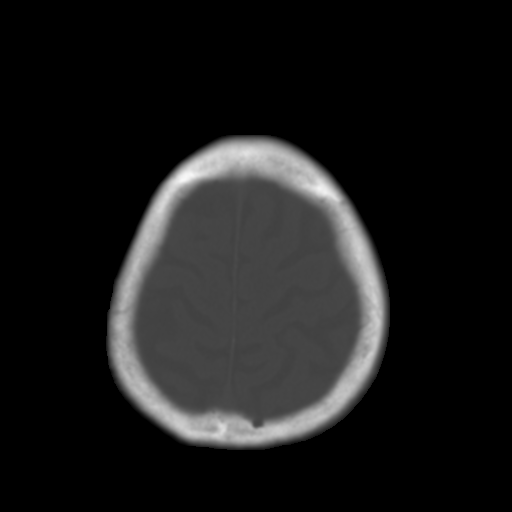
[im 28/31  brain]
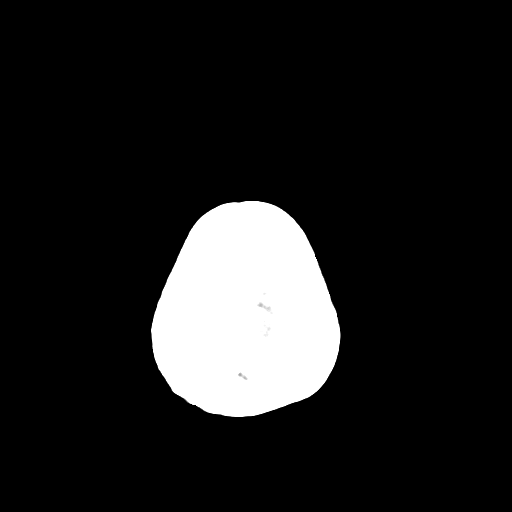

[Series 4: coronal soft tissue · coronal · 0.30mm/px · 3 of 63 slices shown]
[im 22/63  brain]
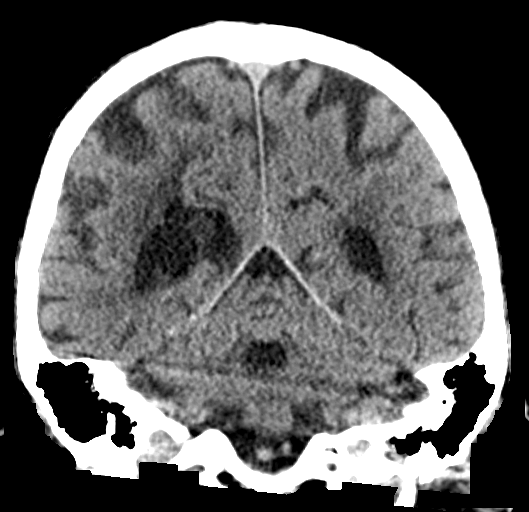
[im 28/63  brain]
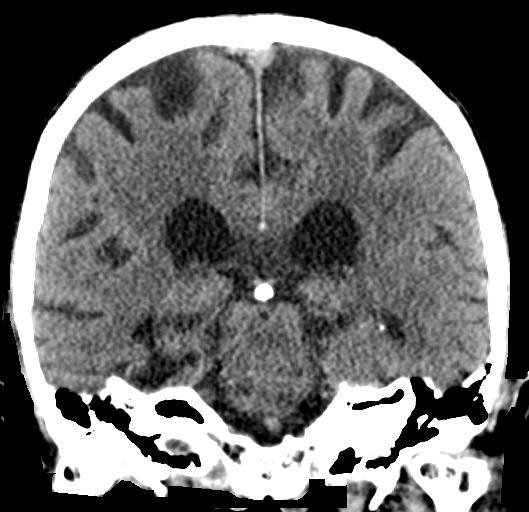
[im 35/63  brain]
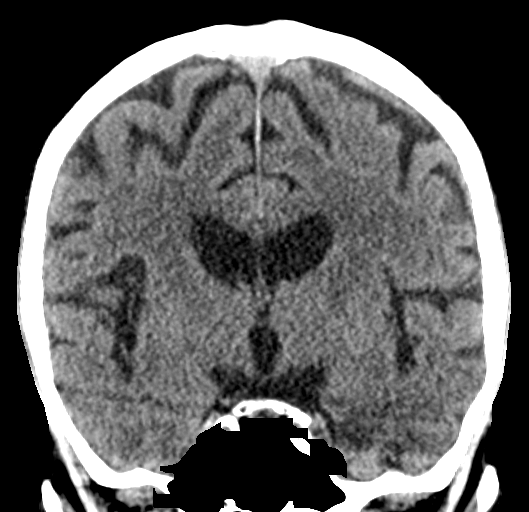

[Series 5: sagittal soft tissue · sagittal · 0.30mm/px · 3 of 53 slices shown]
[im 18/53  brain]
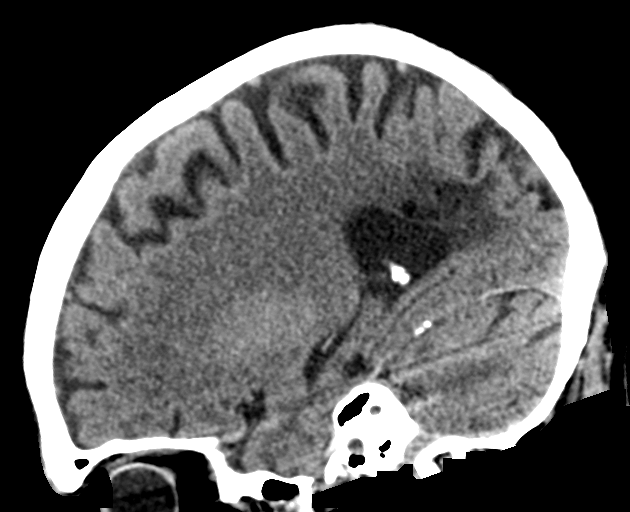
[im 27/53  brain]
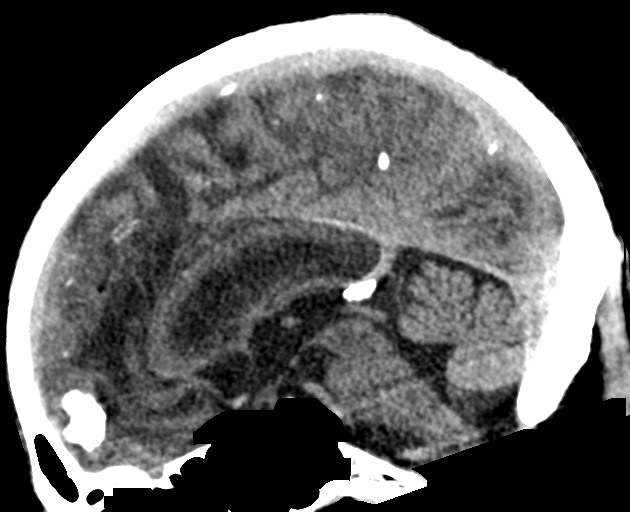
[im 35/53  brain]
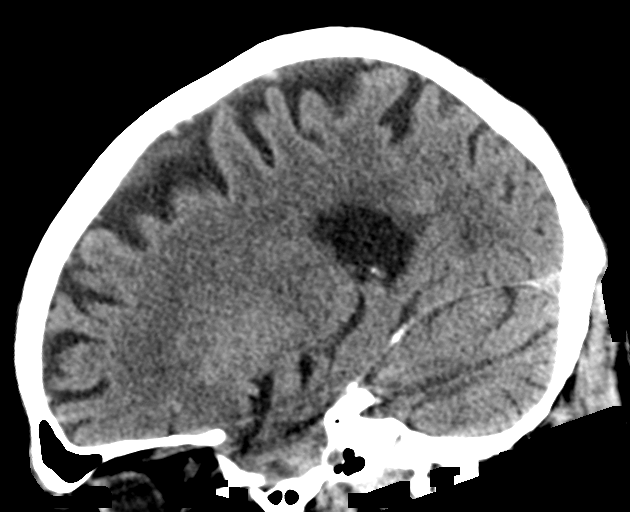

[16 of 47 positions shown; findings below may reference images not displayed]

FINDINGS: Brain: No intracranial hemorrhage, mass effect, or midline shift.
Stable degree of atrophy and chronic small vessel ischemia. No
hydrocephalus. The basilar cisterns are patent. Unchanged right
occipital encephalomalacia. No evidence of territorial infarct or
acute ischemia. No extra-axial or intracranial fluid collection.

Vascular: Atherosclerosis of skullbase vasculature without
hyperdense vessel or abnormal calcification.

Skull: No fracture or focal lesion.

Sinuses/Orbits: Paranasal sinuses and mastoid air cells are clear.
The visualized orbits are unremarkable. Minimal skin irregularity
about the right supraorbital soft tissues likely site of laceration.
Bilateral cataract resection.

Other: None.
IMPRESSION: 1. No acute intracranial abnormality. No skull fracture.
2. Unchanged atrophy and chronic small vessel ischemia. Unchanged
right occipital encephalomalacia.

## 2021-06-20 IMAGING — DX DG CHEST 1V
1 series · 1 of 1 positions shown · non-contrast
Comparison: Chest radiograph from one day prior.

CLINICAL DATA: Syncope, fall, [id number] positive

EXAM:
CHEST  1 VIEW

[chest ap]
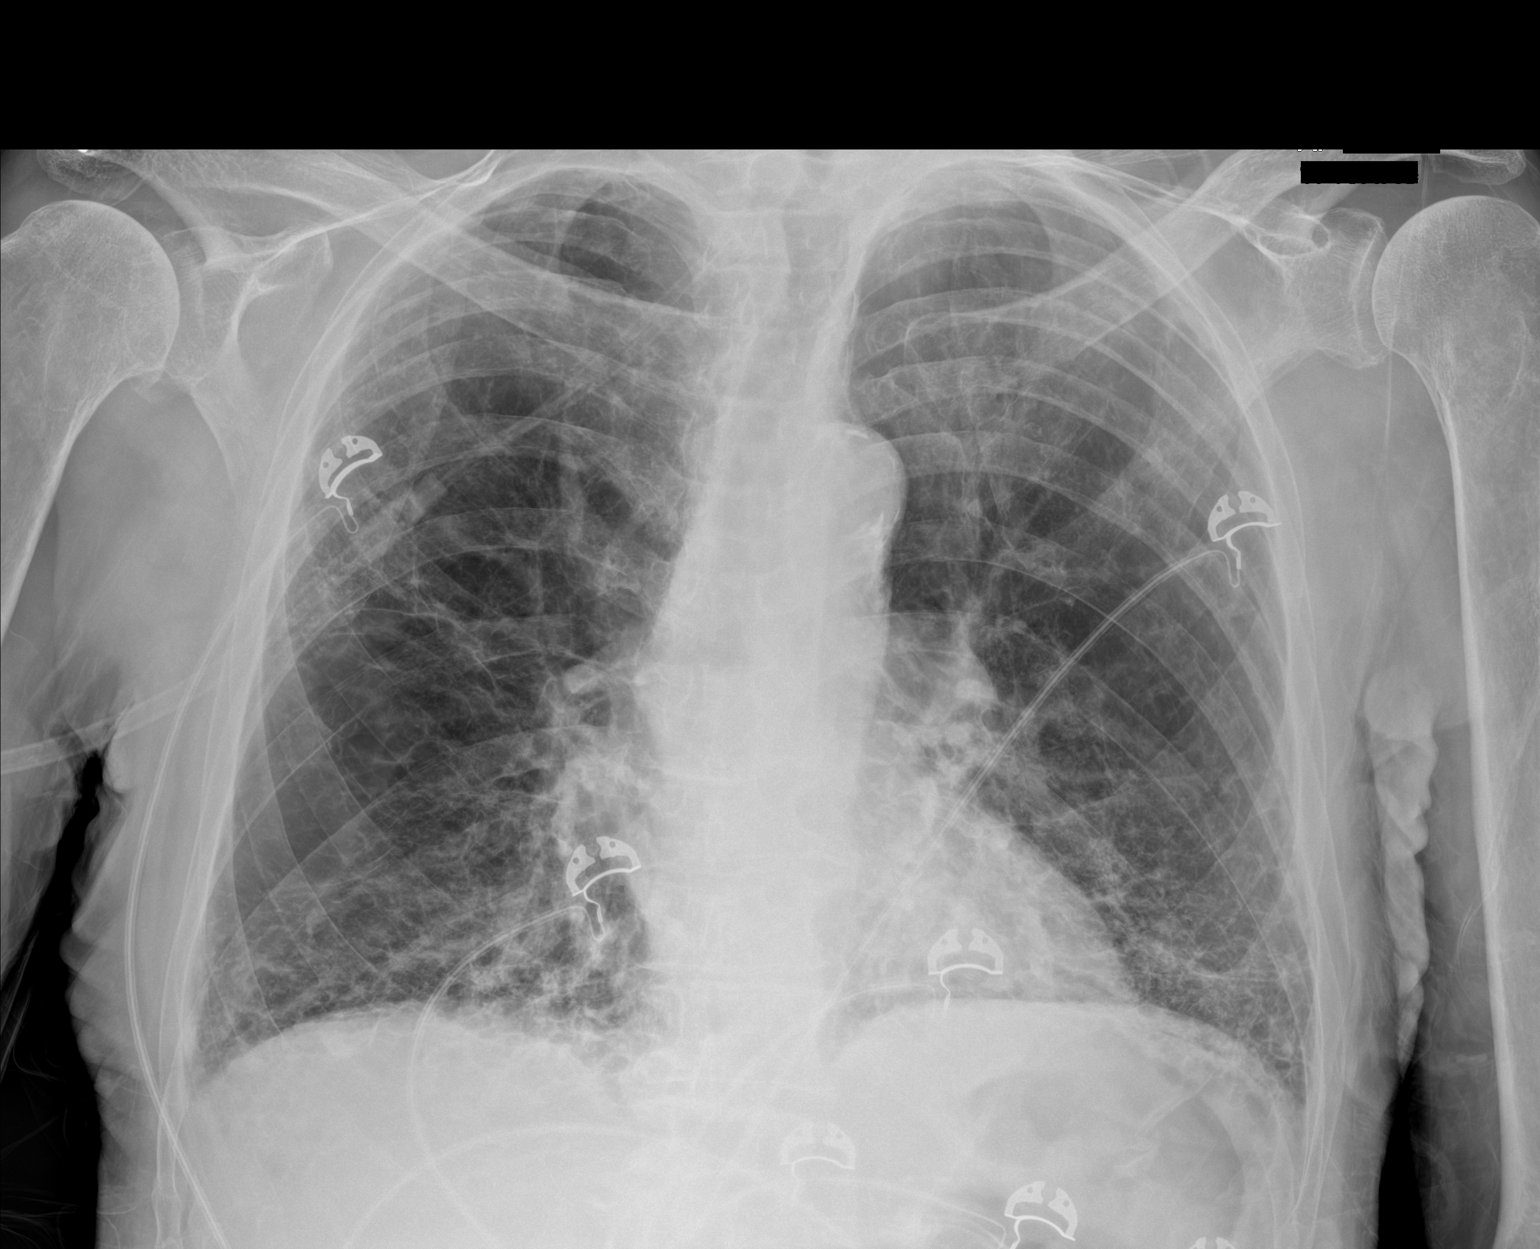

[1 of 1 positions shown; findings below may reference images not displayed]

FINDINGS: Stable cardiomediastinal silhouette with normal heart size. No
pneumothorax. No pleural effusion. Patchy reticular opacities in
both lungs, most prominent at the lung bases, unchanged. No acute
consolidative airspace disease. No pulmonary edema. No displaced
fractures.
IMPRESSION: 1. No acute cardiopulmonary disease.
2. Stable patchy reticular opacities in both lungs, most prominent
at the lung bases, compatible with chronic pulmonary fibrosis.

## 2021-06-21 DEATH — deceased

## 2021-06-22 ENCOUNTER — Encounter (HOSPITAL_COMMUNITY): Payer: Self-pay | Admitting: Radiology
